# Patient Record
Sex: Female | Born: 1990
Health system: Southern US, Community
[De-identification: ages and names within clinical notes are randomized; demographics above are authoritative.]

## PROBLEM LIST (undated history)

## (undated) DIAGNOSIS — Z789 Other specified health status: Secondary | ICD-10-CM

## (undated) HISTORY — PX: NO PAST SURGERIES: SHX2092

## (undated) HISTORY — PX: WISDOM TOOTH EXTRACTION: SHX21

---

## 2015-10-10 DIAGNOSIS — Z01419 Encounter for gynecological examination (general) (routine) without abnormal findings: Secondary | ICD-10-CM | POA: Diagnosis not present

## 2015-10-10 DIAGNOSIS — R102 Pelvic and perineal pain: Secondary | ICD-10-CM | POA: Diagnosis not present

## 2015-10-10 DIAGNOSIS — E559 Vitamin D deficiency, unspecified: Secondary | ICD-10-CM | POA: Diagnosis not present

## 2015-10-10 DIAGNOSIS — Z6822 Body mass index (BMI) 22.0-22.9, adult: Secondary | ICD-10-CM | POA: Diagnosis not present

## 2015-10-10 DIAGNOSIS — Z113 Encounter for screening for infections with a predominantly sexual mode of transmission: Secondary | ICD-10-CM | POA: Diagnosis not present

## 2015-10-10 DIAGNOSIS — Z01411 Encounter for gynecological examination (general) (routine) with abnormal findings: Secondary | ICD-10-CM | POA: Diagnosis not present

## 2015-11-01 DIAGNOSIS — R1031 Right lower quadrant pain: Secondary | ICD-10-CM | POA: Diagnosis not present

## 2016-07-09 ENCOUNTER — Encounter: Payer: Self-pay | Admitting: Obstetrics and Gynecology

## 2016-07-09 ENCOUNTER — Ambulatory Visit: Payer: Self-pay | Admitting: Obstetrics and Gynecology

## 2016-07-09 ENCOUNTER — Ambulatory Visit (INDEPENDENT_AMBULATORY_CARE_PROVIDER_SITE_OTHER): Payer: 59 | Admitting: Obstetrics and Gynecology

## 2016-07-09 ENCOUNTER — Other Ambulatory Visit (INDEPENDENT_AMBULATORY_CARE_PROVIDER_SITE_OTHER): Payer: 59

## 2016-07-09 VITALS — BP 100/58 | HR 84 | Resp 14 | Ht 62.0 in | Wt 123.0 lb

## 2016-07-09 DIAGNOSIS — N83201 Unspecified ovarian cyst, right side: Secondary | ICD-10-CM | POA: Diagnosis not present

## 2016-07-09 DIAGNOSIS — Z3009 Encounter for other general counseling and advice on contraception: Secondary | ICD-10-CM

## 2016-07-09 DIAGNOSIS — R102 Pelvic and perineal pain: Secondary | ICD-10-CM | POA: Diagnosis not present

## 2016-07-09 MED ORDER — LEVONORGEST-ETH ESTRAD 91-DAY 0.1-0.02 & 0.01 MG PO TABS: 1.0000 | ORAL_TABLET | Freq: Every day | ORAL | 1 refills | Status: DC

## 2016-07-09 NOTE — Patient Instructions (Addendum)
Oral Contraception Information Oral contraceptive pills (OCPs) are medicines taken to prevent pregnancy. OCPs work by preventing the ovaries from releasing eggs. The hormones in OCPs also cause the cervical mucus to thicken, preventing the sperm from entering the uterus. The hormones also cause the uterine lining to become thin, not allowing a fertilized egg to attach to the inside of the uterus. OCPs are highly effective when taken exactly as prescribed. However, OCPs do not prevent sexually transmitted diseases (STDs). Safe sex practices, such as using condoms along with the pill, can help prevent STDs.  Before taking the pill, you may have a physical exam and Pap test. Your health care provider may order blood tests. The health care provider will make sure you are a good candidate for oral contraception. Discuss with your health care provider the possible side effects of the OCP you may be prescribed. When starting an OCP, it can take 2 to 3 months for the body to adjust to the changes in hormone levels in your body.  TYPES OF ORAL CONTRACEPTION  The combination pill--This pill contains estrogen and progestin (synthetic progesterone) hormones. The combination pill comes in 21-day, 28-day, or 91-day packs. Some types of combination pills are meant to be taken continuously (365-day pills). With 21-day packs, you do not take pills for 7 days after the last pill. With 28-day packs, the pill is taken every day. The last 7 pills are without hormones. Certain types of pills have more than 21 hormone-containing pills. With 91-day packs, the first 84 pills contain both hormones, and the last 7 pills contain no hormones or contain estrogen only.  The minipill--This pill contains the progesterone hormone only. The pill is taken every day continuously. It is very important to take the pill at the same time each day. The minipill comes in packs of 28 pills. All 28 pills contain the hormone.  ADVANTAGES OF ORAL  CONTRACEPTIVE PILLS  Decreases premenstrual symptoms.   Treats menstrual period cramps.   Regulates the menstrual cycle.   Decreases a heavy menstrual flow.   May treatacne, depending on the type of pill.   Treats abnormal uterine bleeding.   Treats polycystic ovarian syndrome.   Treats endometriosis.   Can be used as emergency contraception.  THINGS THAT CAN MAKE ORAL CONTRACEPTIVE PILLS LESS EFFECTIVE OCPs can be less effective if:   You forget to take the pill at the same time every day.   You have a stomach or intestinal disease that lessens the absorption of the pill.   You take OCPs with other medicines that make OCPs less effective, such as antibiotics, certain HIV medicines, and some seizure medicines.   You take expired OCPs.   You forget to restart the pill on day 7, when using the packs of 21 pills.  RISKS ASSOCIATED WITH ORAL CONTRACEPTIVE PILLS  Oral contraceptive pills can sometimes cause side effects, such as:  Headache.  Nausea.  Breast tenderness.  Irregular bleeding or spotting. Combination pills are also associated with a small increased risk of:  Blood clots.  Heart attack.  Stroke.   This information is not intended to replace advice given to you by your health care provider. Make sure you discuss any questions you have with your health care provider.   Document Released: 12/07/2002 Document Revised: 07/07/2013 Document Reviewed: 03/07/2013 Elsevier Interactive Patient Education 2016 Elsevier Inc. Ovarian Cyst An ovarian cyst is a fluid-filled sac that forms on an ovary. The ovaries are small organs that produce eggs in  women. Various types of cysts can form on the ovaries. Most are not cancerous. Many do not cause problems, and they often go away on their own. Some may cause symptoms and require treatment. Common types of ovarian cysts include:  Functional cysts--These cysts may occur every month during the menstrual cycle.  This is normal. The cysts usually go away with the next menstrual cycle if the woman does not get pregnant. Usually, there are no symptoms with a functional cyst.  Endometrioma cysts--These cysts form from the tissue that lines the uterus. They are also called "chocolate cysts" because they become filled with blood that turns brown. This type of cyst can cause pain in the lower abdomen during intercourse and with your menstrual period.  Cystadenoma cysts--This type develops from the cells on the outside of the ovary. These cysts can get very big and cause lower abdomen pain and pain with intercourse. This type of cyst can twist on itself, cut off its blood supply, and cause severe pain. It can also easily rupture and cause a lot of pain.  Dermoid cysts--This type of cyst is sometimes found in both ovaries. These cysts may contain different kinds of body tissue, such as skin, teeth, hair, or cartilage. They usually do not cause symptoms unless they get very big.  Theca lutein cysts--These cysts occur when too much of a certain hormone (human chorionic gonadotropin) is produced and overstimulates the ovaries to produce an egg. This is most common after procedures used to assist with the conception of a baby (in vitro fertilization). CAUSES   Fertility drugs can cause a condition in which multiple large cysts are formed on the ovaries. This is called ovarian hyperstimulation syndrome.  A condition called polycystic ovary syndrome can cause hormonal imbalances that can lead to nonfunctional ovarian cysts. SIGNS AND SYMPTOMS  Many ovarian cysts do not cause symptoms. If symptoms are present, they may include:  Pelvic pain or pressure.  Pain in the lower abdomen.  Pain during sexual intercourse.  Increasing girth (swelling) of the abdomen.  Abnormal menstrual periods.  Increasing pain with menstrual periods.  Stopping having menstrual periods without being pregnant. DIAGNOSIS  These cysts  are commonly found during a routine or annual pelvic exam. Tests may be ordered to find out more about the cyst. These tests may include:  Ultrasound.  X-ray of the pelvis.  CT scan.  MRI.  Blood tests. TREATMENT  Many ovarian cysts go away on their own without treatment. Your health care provider may want to check your cyst regularly for 2-3 months to see if it changes. For women in menopause, it is particularly important to monitor a cyst closely because of the higher rate of ovarian cancer in menopausal women. When treatment is needed, it may include any of the following:  A procedure to drain the cyst (aspiration). This may be done using a long needle and ultrasound. It can also be done through a laparoscopic procedure. This involves using a thin, lighted tube with a tiny camera on the end (laparoscope) inserted through a small incision.  Surgery to remove the whole cyst. This may be done using laparoscopic surgery or an open surgery involving a larger incision in the lower abdomen.  Hormone treatment or birth control pills. These methods are sometimes used to help dissolve a cyst. HOME CARE INSTRUCTIONS   Only take over-the-counter or prescription medicines as directed by your health care provider.  Follow up with your health care provider as directed.  Get  regular pelvic exams and Pap tests. SEEK MEDICAL CARE IF:   Your periods are late, irregular, or painful, or they stop.  Your pelvic pain or abdominal pain does not go away.  Your abdomen becomes larger or swollen.  You have pressure on your bladder or trouble emptying your bladder completely.  You have pain during sexual intercourse.  You have feelings of fullness, pressure, or discomfort in your stomach.  You lose weight for no apparent reason.  You feel generally ill.  You become constipated.  You lose your appetite.  You develop acne.  You have an increase in body and facial hair.  You are gaining  weight, without changing your exercise and eating habits.  You think you are pregnant. SEEK IMMEDIATE MEDICAL CARE IF:   You have increasing abdominal pain.  You feel sick to your stomach (nauseous), and you throw up (vomit).  You develop a fever that comes on suddenly.  You have abdominal pain during a bowel movement.  Your menstrual periods become heavier than usual. MAKE SURE YOU:  Understand these instructions.  Will watch your condition.  Will get help right away if you are not doing well or get worse.   This information is not intended to replace advice given to you by your health care provider. Make sure you discuss any questions you have with your health care provider.   Document Released: 09/16/2005 Document Revised: 09/21/2013 Document Reviewed: 05/24/2013 Elsevier Interactive Patient Education [employer]2016 Elsevier Inc.  No intercourse for at least 1 week. When sexually active, you should control rate and depth of penetration.

## 2016-07-09 NOTE — Progress Notes (Signed)
GYNECOLOGY  VISIT   HPI: 25 y.o.   Married  Hispanic  female   G0P0000 with Patient's last menstrual period was 06/10/2016.   here c/o RLQ pain. The patient reports a one week h/o RLQ abdominal/pelvic pain. The pain is up to an 8/10 with movement or intercourse. When she is still it is a 1/10. Sharp. She has had intermittent pain in this same area cyclically over the last year. She has pain most months, but not every month. Never on the left. She has baseline issues with constipation, typically has a BM every other day, can go a week at times without BM. No urinary c/o.  She did have an ultrasound last year for the same pain, had a 2 cm cyst.  She has a nexplanon for contraception.  She is worried about remembering the pill. She has had a cycle every month for the last 4 months, sometimes she skips a cycle (this nexplanon has been in for 2 years). She bleeds x 5-6 days. She can saturate a pad in 2 hours. Cramps are bad, controlled with ibuprofen. She c/o decreased libido. Able to enjoy intercourse and have orgasms. She is working full time and going to school full time.   GYNECOLOGIC HISTORY: Patient's last menstrual period was 06/10/2016. Contraception:Nexplanon  Menopausal hormone therapy: none         OB History    Gravida Para Term Preterm AB Living   0 0 0 0 0 0   SAB TAB Ectopic Multiple Live Births   0 0 0 0 0         There are no active problems to display for this patient.   No past medical history on file.  No past surgical history on file.  Current Outpatient Prescriptions  Medication Sig Dispense Refill  . etonogestrel (NEXPLANON) 68 MG IMPL implant Inject into the skin.     No current facility-administered medications for this visit.      ALLERGIES: Review of patient's allergies indicates no known allergies.  Family History  Problem Relation Age of Onset  . Cirrhosis Mother     Social History   Social History  . Marital status: Married    Spouse  name: N/A  . Number of children: N/A  . Years of education: N/A   Occupational History  . Not on file.   Social History Main Topics  . Smoking status: Never Smoker  . Smokeless tobacco: Never Used  . Alcohol use No  . Drug use: No  . Sexual activity: Yes    Partners: Male    Birth control/ protection: Implant   Other Topics Concern  . Not on file   Social History Narrative  . No narrative on file    Review of Systems  Constitutional: Negative.   HENT: Negative.   Eyes: Negative.   Respiratory: Negative.   Cardiovascular: Negative.   Gastrointestinal: Negative.   Genitourinary:       RLQ pain   Musculoskeletal: Negative.   Skin: Negative.   Neurological: Negative.   Endo/Heme/Allergies: Negative.   Psychiatric/Behavioral: Negative.     PHYSICAL EXAMINATION:    BP (!) 100/58 (BP Location: Right Arm, Patient Position: Sitting, Cuff Size: Normal)   Pulse 84   Resp 14   Ht 5\' 2"  (1.575 m)   Wt 123 lb (55.8 kg)   LMP 06/10/2016   BMI 22.50 kg/m     General appearance: alert, cooperative and appears stated age Abdomen: soft, non-tender; non-distended,  no masses,  no organomegaly  Pelvic: External genitalia:  no lesions              Urethra:  normal appearing urethra with no masses, tenderness or lesions              Cervix: no cervical motion tenderness              Bimanual Exam:  Uterus:  normal size, contour, position, consistency, mobility, non-tender and anteverted              Adnexa: right adnexal mass, tender, approximately 4-5 cm. No masses or tenderness on the left                Ultrasound images reviewed with the patient.   ASSESSMENT RLQ abdominal/pelvic pain Right ovarian cyst, 4.2 cm, simple, +blood flow. Likely follicular cyst Contraception, currently has a nexplanon Low libido, likely from stress (work and school full time) Constipation    PLAN Ibuprofen for pain (over the counter) Avoid intercourse for at least the next week When  sexually active, she needs control rate and depth of penetration F/U exam in 3 months. The cyst is easily palpated on exam, if still present at her f/u visit will schedule another ultrasound Discussed that the nexplanon could be causing the ovarian cysts Discussed the use of OCP's to suppress ovarian cysts.   No contraindications to OCP's, risks reviewed Will start loseasonique, she will alarm her phone to remember to take it If she does well with OCP's in the next few months, will pull the nexplanon Due for an annual exam in 1/18 Discussed libido, information given Discussed fruit, fiber, fluids, metamucil and miralax. Discussed which fruits to avoid.    An After Visit Summary was printed and given to the patient.

## 2016-07-12 MED FILL — CAMRESE LO TABLET: 0.1-0.02 & | 90 days supply | Qty: 91 | Fill #0

## 2016-08-26 ENCOUNTER — Ambulatory Visit: Payer: Self-pay | Admitting: Obstetrics and Gynecology

## 2016-10-23 DIAGNOSIS — Z01419 Encounter for gynecological examination (general) (routine) without abnormal findings: Secondary | ICD-10-CM | POA: Diagnosis not present

## 2016-10-23 DIAGNOSIS — Z6822 Body mass index (BMI) 22.0-22.9, adult: Secondary | ICD-10-CM | POA: Diagnosis not present

## 2016-10-23 DIAGNOSIS — E559 Vitamin D deficiency, unspecified: Secondary | ICD-10-CM | POA: Diagnosis not present

## 2016-10-23 DIAGNOSIS — Z3149 Encounter for other procreative investigation and testing: Secondary | ICD-10-CM | POA: Diagnosis not present

## 2016-11-29 ENCOUNTER — Ambulatory Visit (INDEPENDENT_AMBULATORY_CARE_PROVIDER_SITE_OTHER): Payer: 59 | Admitting: Nurse Practitioner

## 2016-11-29 ENCOUNTER — Encounter: Payer: Self-pay | Admitting: Nurse Practitioner

## 2016-11-29 VITALS — BP 118/64 | HR 64 | Ht 62.0 in | Wt 121.0 lb

## 2016-11-29 DIAGNOSIS — S6992XA Unspecified injury of left wrist, hand and finger(s), initial encounter: Secondary | ICD-10-CM

## 2016-11-29 NOTE — Progress Notes (Signed)
26 y.o. Married Hispanic female G0P0000 here for left thumb injury that occurred before Christmas when she slammed into a car door.  It has been growing out but now at 2 levels midway and is loose.  She has been trimming the nail but is getting caught on clothing which causes more discomfort.  She now desires the end of nail to come off.   O: Healthy WD,WN female Affect: normal Skin & Nails:  Left thumb has partially grown up to the middle.  The top half is discolored at the middle of original trauma.  The very tip of nail is adhered to the skin.  Area is cleansed with Betadine and TB syringe with Lidocaine is injected into both side at 0.1 amounts.  The lateral side then is lifted up and trimmed going to the top and medial side.  The most medial side is then lifted and cut with minimal amount of bleeding.  Rest of nail was removed without a problem.  The remaining triangle piece was then removed with hemostat.  Area is then cleaned and antibacterial ointment is placed with dressing.      A: Trauma to left thumbnail  Removal of remainder of thumbnail     P:  Discussed and instructions for wound care with removal of bandage in a few hours.  Keep area clean and dry over the weekend and may use a band aid prn.  She did take 2 Advil today.    RV

## 2016-11-29 NOTE — Patient Instructions (Signed)
Keep area clean and dry. °

## 2016-12-08 NOTE — Progress Notes (Signed)
Encounter reviewed by Dr. Ezella Kell Amundson C. Silva.  

## 2016-12-23 MED FILL — LEVONO-E ESTRAD 0.10-0.02-0: 0.1-0.02 & | 90 days supply | Qty: 91 | Fill #1

## 2016-12-25 ENCOUNTER — Ambulatory Visit (INDEPENDENT_AMBULATORY_CARE_PROVIDER_SITE_OTHER): Payer: 59 | Admitting: Obstetrics and Gynecology

## 2016-12-25 ENCOUNTER — Encounter: Payer: Self-pay | Admitting: Obstetrics and Gynecology

## 2016-12-25 VITALS — BP 102/68 | HR 72 | Resp 16 | Ht 62.0 in | Wt 123.0 lb

## 2016-12-25 DIAGNOSIS — Z3046 Encounter for surveillance of implantable subdermal contraceptive: Secondary | ICD-10-CM

## 2016-12-25 NOTE — Progress Notes (Signed)
GYNECOLOGY  VISIT   HPI: 26 y.o.   Married  Hispanic  female   G0P0000 with No LMP recorded. Patient has had an implant.   here for  Nexplanon removal. She has started on OCP's and is doing well on them.   GYNECOLOGIC HISTORY: No LMP recorded. Patient has had an implant. Contraception:Nexplanon  Menopausal hormone therapy: none         OB History    Gravida Para Term Preterm AB Living   0 0 0 0 0 0   SAB TAB Ectopic Multiple Live Births   0 0 0 0 0         There are no active problems to display for this patient.   No past medical history on file.  No past surgical history on file.  Current Outpatient Prescriptions  Medication Sig Dispense Refill  . etonogestrel (NEXPLANON) 68 MG IMPL implant Inject into the skin.    . Levonorgestrel-Ethinyl Estradiol (LOSEASONIQUE) 0.1-0.02 & 0.01 MG tablet Take 1 tablet by mouth daily. 1 Package 1   No current facility-administered medications for this visit.      ALLERGIES: Patient has no known allergies.  Family History  Problem Relation Age of Onset  . Cirrhosis Mother     Social History   Social History  . Marital status: Married    Spouse name: N/A  . Number of children: N/A  . Years of education: N/A   Occupational History  . Not on file.   Social History Main Topics  . Smoking status: Never Smoker  . Smokeless tobacco: Never Used  . Alcohol use No  . Drug use: No  . Sexual activity: Yes    Partners: Male    Birth control/ protection: Implant   Other Topics Concern  . Not on file   Social History Narrative  . No narrative on file    Review of Systems  Constitutional: Negative.   HENT: Negative.   Eyes: Negative.   Respiratory: Negative.   Cardiovascular: Negative.   Gastrointestinal: Negative.   Genitourinary: Negative.   Musculoskeletal: Negative.   Skin: Negative.   Neurological: Negative.   Endo/Heme/Allergies: Negative.   Psychiatric/Behavioral: Negative.     PHYSICAL EXAMINATION:    BP  102/68 (BP Location: Right Arm, Patient Position: Sitting, Cuff Size: Normal)   Pulse 72   Resp 16   Wt 123 lb (55.8 kg)   BMI 22.50 kg/m     General appearance: alert, cooperative and appears stated age   Risks of nexplanon removal was reviewed with the patient and a consent was signed.  The patient was placed in the supine position with her left arm bent at the elbow. The area was cleansed with betadine and injected with 1% lidocaine. A #11 blade was used to incise over the distal end of the nexplanon implant. The implant was grasped with a clamp and removed.   The patients arm was cleansed of betadine and a steri strip was placed over the incision. A gauze was wrapped around her arm.  She tolerated the procedure well  Instructions for care were discussed.     ASSESSMENT Nexplanon removal done    PLAN She is on OCP's Routine f/u   An After Visit Summary was printed and given to the patient.

## 2017-04-08 DIAGNOSIS — M25511 Pain in right shoulder: Secondary | ICD-10-CM | POA: Diagnosis not present

## 2017-05-24 ENCOUNTER — Emergency Department (INDEPENDENT_AMBULATORY_CARE_PROVIDER_SITE_OTHER)
Admission: EM | Admit: 2017-05-24 | Discharge: 2017-05-24 | Disposition: A | Discharge status: Home / Self Care | Payer: 59 | Source: Home / Self Care | Attending: Family Medicine | Admitting: Family Medicine

## 2017-05-24 ENCOUNTER — Encounter: Payer: Self-pay | Admitting: Emergency Medicine

## 2017-05-24 ENCOUNTER — Other Ambulatory Visit: Payer: Self-pay | Admitting: Family Medicine

## 2017-05-24 DIAGNOSIS — J029 Acute pharyngitis, unspecified: Secondary | ICD-10-CM

## 2017-05-24 LAB — POCT RAPID STREP A (OFFICE): RAPID STREP A SCREEN: NEGATIVE

## 2017-05-24 MED ORDER — PENICILLIN V POTASSIUM 500 MG PO TABS: ORAL_TABLET | ORAL | 0 refills | Status: DC

## 2017-05-24 NOTE — ED Provider Notes (Signed)
[facility]    CSN: [id number] Arrival date & time: 05/24/17  1652     History   Chief Complaint Chief Complaint  Patient presents with  . Sore Throat    HPI Renee Lucas is a 26 y.o. female.   Patient developed sore throat three days ago.  She has been fatigued with myalgias, headache, and increased sinus congestion. She recently returned from [place].  She reports that she has had a negative rapid strep test in the past with subsequent positive throat culture.   The history is provided by the patient.    History reviewed. No pertinent past medical history.  There are no active problems to display for this patient.   History reviewed. No pertinent surgical history.  OB History    Gravida Para Term Preterm AB Living   0 0 0 0 0 0   SAB TAB Ectopic Multiple Live Births   0 0 0 0 0       Home Medications    Prior to Admission medications   Medication Sig Start Date End Date Taking? Authorizing Provider  etonogestrel (NEXPLANON) 68 MG IMPL implant Inject into the skin.    [provider]  Levonorgestrel-Ethinyl Estradiol (LOSEASONIQUE) 0.1-0.02 & 0.01 MG tablet Take 1 tablet by mouth daily. 07/09/16   Romualdo Bolk, MD  penicillin v potassium (VEETID) 500 MG tablet Take one tab by mouth twice daily for 10 days (Rx void after 05/31/17) 05/24/17   Lattie Haw, MD    Family History Family History  Problem Relation Age of Onset  . Cirrhosis Mother     Social History Social History  Substance Use Topics  . Smoking status: Never Smoker  . Smokeless tobacco: Never Used  . Alcohol use No     Allergies   Patient has no known allergies.   Review of Systems Review of Systems + sore throat No cough No pleuritic pain No wheezing + nasal congestion + post-nasal drainage No sinus pain/pressure No itchy/red eyes No earache No hemoptysis No SOB No fever/chills No nausea No vomiting No abdominal pain No diarrhea No  urinary symptoms No skin rash + fatigue + myalgias + headache Used OTC meds without relief   Physical Exam Triage Vital Signs ED Triage Vitals  Enc Vitals Group     BP 05/24/17 1734 114/75     Pulse Rate 05/24/17 1734 87     Resp 05/24/17 1734 16     Temp 05/24/17 1734 98.2 F (36.8 C)     Temp Source 05/24/17 1734 Oral     SpO2 05/24/17 1734 99 %     Weight 05/24/17 1734 117 lb 8 oz (53.3 kg)     Height 05/24/17 1734 5\' 2"  (1.575 m)     Head Circumference --      Peak Flow --      Pain Score 05/24/17 1735 4     Pain Loc --      Pain Edu? --      Excl. in GC? --    No data found.   Updated Vital Signs BP 114/75 (BP Location: Left Arm)   Pulse 87   Temp 98.2 F (36.8 C) (Oral)   Resp 16   Ht 5\' 2"  (1.575 m)   Wt 117 lb 8 oz (53.3 kg)   SpO2 99%   BMI 21.49 kg/m   Visual Acuity Right Eye Distance:   Left Eye Distance:   Bilateral Distance:  Right Eye Near:   Left Eye Near:    Bilateral Near:     Physical Exam Nursing notes and Vital Signs reviewed. Appearance:  Patient appears stated age, and in no acute distress Eyes:  Pupils are equal, round, and reactive to light and accomodation.  Extraocular movement is intact.  Conjunctivae are not inflamed  Ears:  Canals normal.  Tympanic membranes normal.  Nose:  Mildly congested turbinates.  No sinus tenderness.   Pharynx:  Mildly erythematous Neck:  Supple.  Enlarged posterior/lateral nodes are palpated bilaterally, tender to palpation on the left.   Lungs:  Clear to auscultation.  Breath sounds are equal.  Moving air well. Heart:  Regular rate and rhythm without murmurs, rubs, or gallops.  Abdomen:  Nontender without masses or hepatosplenomegaly.  Bowel sounds are present.  No CVA or flank tenderness.  Extremities:  No edema.  Skin:  No rash present.    UC Treatments / Results  Labs (all labs ordered are listed, but only abnormal results are displayed) Labs Reviewed  CULTURE, GROUP A STREP [facility])  POCT  RAPID STREP A (OFFICE) negative    EKG  EKG Interpretation None       Radiology No results found.  Procedures Procedures (including critical care time)  Medications Ordered in UC Medications - No data to display   Initial Impression / Assessment and Plan / UC Course  I have reviewed the triage vital signs and the nursing notes.  Pertinent labs & imaging results that were available during my care of the patient were reviewed by me and considered in my medical decision making (see chart for details).    Suspect early viral URI.  Throat culture pending. Try warm salt water gargles for sore throat.  May continue Ibuprofen 200mg , 3 or 4 tabs every 8 hours with food for sore throat, body aches, etc. Begin penicillin if throat culture is positive (given Rx to hold).  If cold-like symptoms develop, try the following: Take plain guaifenesin (1200mg  extended release tabs such as Mucinex) twice daily, with plenty of water, for cough and congestion.  May add Pseudoephedrine (30mg , one or two every 4 to 6 hours) for sinus congestion.  Get adequate rest.   May use Afrin nasal spray (or generic oxymetazoline) each morning for about 5 days and then discontinue.  Also recommend using saline nasal spray several times daily and saline nasal irrigation (AYR is a common brand).  Use Flonase nasal spray each morning after using Afrin nasal spray and saline nasal irrigation. May take Delsym Cough Suppressant at bedtime for nighttime cough.  Stop all antihistamines for now, and other non-prescription cough/cold preparations.    Final Clinical Impressions(s) / UC Diagnoses   Final diagnoses:  Pharyngitis, unspecified etiology    New Prescriptions New Prescriptions   PENICILLIN V POTASSIUM (VEETID) 500 MG TABLET    Take one tab by mouth twice daily for 10 days (Rx void after 05/31/17)        Lattie Haw, MD 06/01/17 [phone]

## 2017-05-24 NOTE — ED Triage Notes (Signed)
Patient presents to [facility] with C/O sore throat times 3 days. Hurts to swallow 4/10. Patrient has been to [place] in the las two weeks.

## 2017-05-24 NOTE — Discharge Instructions (Signed)
Try warm salt water gargles for sore throat.  May continue Ibuprofen 200mg , 3 or 4 tabs every 8 hours with food for sore throat, body aches, etc. Begin antibiotic if throat culture is positive.  If cold-like symptoms develop, try the following: Take plain guaifenesin (1200mg  extended release tabs such as Mucinex) twice daily, with plenty of water, for cough and congestion.  May add Pseudoephedrine (30mg , one or two every 4 to 6 hours) for sinus congestion.  Get adequate rest.   May use Afrin nasal spray (or generic oxymetazoline) each morning for about 5 days and then discontinue.  Also recommend using saline nasal spray several times daily and saline nasal irrigation (AYR is a common brand).  Use Flonase nasal spray each morning after using Afrin nasal spray and saline nasal irrigation. May take Delsym Cough Suppressant at bedtime for nighttime cough.  Stop all antihistamines for now, and other non-prescription cough/cold preparations.

## 2017-05-26 ENCOUNTER — Telehealth: Payer: Self-pay | Admitting: *Deleted

## 2017-05-26 LAB — CULTURE, GROUP A STREP

## 2017-05-26 NOTE — Telephone Encounter (Signed)
Patient advised of negative TCX.

## 2017-09-17 DIAGNOSIS — M25511 Pain in right shoulder: Secondary | ICD-10-CM | POA: Diagnosis not present

## 2017-09-22 ENCOUNTER — Other Ambulatory Visit: Payer: Self-pay | Admitting: Physician Assistant

## 2017-09-22 DIAGNOSIS — M25511 Pain in right shoulder: Secondary | ICD-10-CM

## 2017-09-29 ENCOUNTER — Ambulatory Visit (INDEPENDENT_AMBULATORY_CARE_PROVIDER_SITE_OTHER): Payer: 59

## 2017-09-29 DIAGNOSIS — M25511 Pain in right shoulder: Secondary | ICD-10-CM | POA: Diagnosis not present

## 2017-09-29 DIAGNOSIS — M7501 Adhesive capsulitis of right shoulder: Secondary | ICD-10-CM | POA: Diagnosis not present

## 2017-10-22 ENCOUNTER — Encounter: Payer: Self-pay | Admitting: Rehabilitative and Restorative Service Providers"

## 2017-10-22 ENCOUNTER — Ambulatory Visit (INDEPENDENT_AMBULATORY_CARE_PROVIDER_SITE_OTHER): Payer: 59 | Admitting: Rehabilitative and Restorative Service Providers"

## 2017-10-22 DIAGNOSIS — R293 Abnormal posture: Secondary | ICD-10-CM | POA: Diagnosis not present

## 2017-10-22 DIAGNOSIS — M25511 Pain in right shoulder: Secondary | ICD-10-CM | POA: Diagnosis not present

## 2017-10-22 DIAGNOSIS — G8929 Other chronic pain: Secondary | ICD-10-CM

## 2017-10-22 DIAGNOSIS — R29898 Other symptoms and signs involving the musculoskeletal system: Secondary | ICD-10-CM

## 2017-10-22 NOTE — Therapy (Signed)
[facility] [address] [place], [place], [address] Phone: [phone]   Fax:  [phone]  Physical Therapy Evaluation  Patient Details  Name: Renee Lucas MRN: [id number] Date of Birth: 02/15/1991 Referring Provider: Marvene StaffSazonne Stratton PA-C   Encounter Date: 10/22/2017  PT End of Session - 10/22/17 0940    Visit Number  1    Number of Visits  12    Date for PT Re-Evaluation  12/03/17    PT Start Time  0933    PT Stop Time  1030    PT Time Calculation (min)  57 min    Activity Tolerance  Patient tolerated treatment well       History reviewed. No pertinent past medical history.  History reviewed. No pertinent surgical history.  There were no vitals filed for this visit.   Subjective Assessment - 10/22/17 0942    Subjective  Renee Lucas reports that she has been having Rt shoulder pain for a year. She remembers pulling on a rope while tubing in the summer. She started having Rt shoulder pain a couple of weeks later. She continued to have symtpoms with intensity gradually increasing. Patient has pain at night and is now unable to sleep on Rt shoulder. Pain is primarily at night.     Diagnostic tests  xray MRI- frozen shoulder tendonitis    Patient Stated Goals  get rid of the Rt shoulder pain - sleep at night     Currently in Pain?  Yes    Pain Score  1     Pain Location  Shoulder    Pain Orientation  Right    Pain Descriptors / Indicators  Dull    Pain Type  Chronic pain    Pain Radiating Towards  shoulder blade area     Pain Onset  More than a month ago    Pain Frequency  Constant    Aggravating Factors   lying on Rt side; trying to sleep; household chores will irritate symtpoms; busy day at work increases pain at night     Pain Relieving Factors  meds         [facility] - 10/22/17 0001      Assessment   Medical Diagnosis  Rt shoulder pain     Referring Provider  Marvene StaffSazonne Stratton PA-C    Onset  Date/Surgical Date  09/30/16    Hand Dominance  Right    Next MD Visit  PRN    Prior Therapy  none       Precautions   Precautions  None      Balance Screen   Has the patient fallen in the past 6 months  No    Has the patient had a decrease in activity level because of a fear of falling?   No    Is the patient reluctant to leave their home because of a fear of falling?   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     Leisure  household chores       Observation/Other Assessments   Focus on Therapeutic Outcomes (FOTO)   33% limitation       Sensation   Additional Comments  some numbness in Rt hand if she is lying on the Rt side  Posture/Postural Control   Posture Comments  head forward; shoulders rounded and elevated, Head of humerus anterior in orientation; scapulae abducted and rotated along the thoracic wall       AROM   Right Shoulder Extension  60 Degrees    Right Shoulder Flexion  151 Degrees    Right Shoulder ABduction  166 Degrees    Right Shoulder Internal Rotation  33 Degrees    Right Shoulder External Rotation  79 Degrees    Left Shoulder Extension  55 Degrees    Left Shoulder Flexion  167 Degrees    Left Shoulder ABduction  172 Degrees    Left Shoulder Internal Rotation  51 Degrees    Left Shoulder External Rotation  99 Degrees      Strength   Overall Strength Comments  5/5 bilat except Rt middle trap 5-/5 and lower trap 4+/5 and painful       Palpation   Spinal mobility  decreased mobilty thoracic spine     Palpation comment  significant tightness Rt > Lt upper trap. leveator, pecs, teres, biceps              Objective measurements completed on examination: See above findings.      [facility] Adult PT Treatment/Exercise - 10/22/17 0001      Neuro Re-ed    Neuro Re-ed Details   working on posture and  alignment       Shoulder Exercises: Standing   Other Standing Exercises  axial extension 10 sec x 5; scap squeeze 10 sec x 10; L's x 10; W's x 10 all with swim noodle       Shoulder Exercises: Stretch   Other Shoulder Stretches  3 way doorway 30 sec x 3 reps each position     Other Shoulder Stretches  shoulder flexion sliding hand up door facing 30 sec hold x 2       Moist Heat Therapy   Number Minutes Moist Heat  15 Minutes    Moist Heat Location  Shoulder Rt      Electrical Stimulation   Electrical Stimulation Location  Rt shoulder girdle     Electrical Stimulation Action  IFC    Electrical Stimulation Parameters  to tolerance    Electrical Stimulation Goals  Pain;Tone             PT Education - 10/22/17 1017    Education provided  Yes    Education Details  posture; HEP; TENS     Person(s) Educated  Patient    Methods  Explanation;Demonstration;Tactile cues;Verbal cues;Handout    Comprehension  Verbalized understanding;Returned demonstration;Verbal cues required          PT Long Term Goals - 10/22/17 1123      PT LONG TERM GOAL #1   Title  Improve posture and alignment with patient to demonstrated good upright posture with posterior shoulder girdle musculature engaged 12/03/17    Time  6    Period  Weeks    Status  New      PT LONG TERM GOAL #2   Title  Increased pain free ROM Rt shoulder to equal Lt shoulder 12/03/17    Time  6    Period  Weeks    Status  New      PT LONG TERM GOAL #3   Title  Patient reports sleeping through the night without awakening due to pain 12/03/17    Time  6    Period  Weeks  Status  New      PT LONG TERM GOAL #4   Title  Independent in HEP 12/03/17    Time  6    Period  Weeks    Status  New      PT LONG TERM GOAL #5   Title  Improve FOTO to 23% limitation 12/03/17    Time  6    Period  Weeks    Status  New             Plan - 10/22/17 1117    Clinical Impression Statement  Patient presents with ~ 1 year history of  Rt shoulder pain gradually progressing with pain primarily at night and with functional activities such as lying on Rt side and reaching behind her back. She has signs and symptoms consistent with adhesive capsulitis Rt shoulder. Patient has poor posture and alignment; limited shoulder Rt ROM; weakness and pain with resistive muscle testing in posterior shoudler girdle musculature; pain and tightness with palpation Rt shoulder pecs; upper trap; leveator; biceps; anterior deltoid; teres; decreased ability to sleep and decresaed functional activity tolerance. Patient will benefit from PT to address problems identified.     Clinical Presentation  Stable    Clinical Decision Making  Low    Rehab Potential  Good    PT Frequency  2x / week    PT Duration  6 weeks    PT Treatment/Interventions  Patient/family education;ADLs/Self Care Home Management;Cryotherapy;Electrical Stimulation;Iontophoresis 4mg /ml Dexamethasone;Moist Heat;Ultrasound;Dry needling;Manual techniques;Neuromuscular re-education;Therapeutic activities;Therapeutic exercise    PT Next Visit Plan  review HEP; progress with pulleys for ROM; stretch IR; manual work through the [employer] girdle with PROM/stretching Rt shoulder; modalities as indicated     Consulted and Agree with Plan of Care  Patient       Patient will benefit from skilled therapeutic intervention in order to improve the following deficits and impairments:  Postural dysfunction, Improper body mechanics, Pain, Impaired UE functional use, Increased muscle spasms, Increased fascial restricitons, Decreased mobility, Decreased range of motion, Decreased strength, Decreased activity tolerance  Visit Diagnosis: Chronic right shoulder pain - Plan: PT plan of care cert/re-cert  Other symptoms and signs involving the musculoskeletal system - Plan: PT plan of care cert/re-cert  Abnormal posture - Plan: PT plan of care cert/re-cert     Problem List There are no active problems  to display for this patient.   Tavita Eastham Rober Minion PT, MPH  10/22/2017, 11:28 AM  [facility] [address] [place], [place], [address] Phone: [phone]   Fax:  [phone]  Name: Renee Lucas MRN: [id number] Date of Birth: 06/01/1991

## 2017-10-22 NOTE — Patient Instructions (Signed)
Axial Extension (Chin Tuck)    Pull chin in and lengthen back of neck. Hold __5__ seconds while counting out loud. Repeat __10__ times. Do __several__ sessions per day.  Shoulder Blade Squeeze    Rotate shoulders back, then squeeze shoulder blades down and back. Hold 10 sec Repeat __10__ times. Do __several__ sessions per day. Can use swim noodle for tactile cues but can also do this without the noodle  Lift chest and bring shoulder blades down and back   Upper Back Strength: Lower Trapezius / Rotator Cuff " L's "     Arms in waitress pose, palms up. Press hands back and slide shoulder blades down. Hold for __5__ seconds. Repeat _10___ times. 1-2 times per day.    Scapular Retraction: Elbow Flexion (Standing)  "W's"     With elbows bent to 90, pinch shoulder blades together and rotate arms out, keeping elbows bent. Repeat __10__ times per set. Do __1-2__ sets per session. Do _several ___ sessions per day.   Scapula Adduction With Pectoralis Stretch: Low - Standing   Shoulders at 45 hands even with shoulders, keeping weight through legs, shift weight forward until you feel pull or stretch through the front of your chest. Hold _30__ seconds. Do _3__ times, _2-4__ times per day.   Scapula Adduction With Pectoralis Stretch: Mid-Range - Standing   Shoulders at 90 elbows even with shoulders, keeping weight through legs, shift weight forward until you feel pull or strength through the front of your chest. Hold __30_ seconds. Do _3__ times, __2-4_ times per day.   Scapula Adduction With Pectoralis Stretch: High - Standing   Shoulders at 120 hands up high on the doorway, keeping weight on feet, shift weight forward until you feel pull or stretch through the front of your chest. Hold _30__ seconds. Do _3__ times, _2-3__ times per day.  Slide right arm straight up the doorway until you feel a stretch. Hold 15-20 sec slide back down. Repeat 3-5 reps 2-3 times/day.   TENS  UNIT: This is helpful for muscle pain and spasm.   Search and Purchase a TENS 7000 2nd edition at www.tenspros.com. It should be less than $30.     TENS unit instructions: Do not shower or bathe with the unit on Turn the unit off before removing electrodes or batteries If the electrodes lose stickiness add a drop of water to the electrodes after they are disconnected from the unit and place on plastic sheet. If you continued to have difficulty, call the TENS unit company to purchase more electrodes. Do not apply lotion on the skin area prior to use. Make sure the skin is clean and dry as this will help prolong the life of the electrodes. After use, always check skin for unusual red areas, rash or other skin difficulties. If there are any skin problems, does not apply electrodes to the same area. Never remove the electrodes from the unit by pulling the wires. Do not use the TENS unit or electrodes other than as directed. Do not change electrode placement without consultating your therapist or physician. Keep 2 fingers with between each electrode.     [facility] [address] [place], [place] [address]  [phone] (office) [phone] (fax)

## 2017-10-29 ENCOUNTER — Encounter: Payer: Self-pay | Admitting: Rehabilitative and Restorative Service Providers"

## 2017-10-29 ENCOUNTER — Ambulatory Visit (INDEPENDENT_AMBULATORY_CARE_PROVIDER_SITE_OTHER): Payer: 59 | Admitting: Rehabilitative and Restorative Service Providers"

## 2017-10-29 DIAGNOSIS — R29898 Other symptoms and signs involving the musculoskeletal system: Secondary | ICD-10-CM

## 2017-10-29 DIAGNOSIS — M25511 Pain in right shoulder: Secondary | ICD-10-CM | POA: Diagnosis not present

## 2017-10-29 DIAGNOSIS — R293 Abnormal posture: Secondary | ICD-10-CM

## 2017-10-29 DIAGNOSIS — G8929 Other chronic pain: Secondary | ICD-10-CM | POA: Diagnosis not present

## 2017-10-29 NOTE — Patient Instructions (Signed)
ELBOW: Biceps - Standing    Standing in doorway, place one hand on wall, elbow straight. Step forward with weight staying through the legs. Hold _20-30__ seconds. _3__ reps per set, _1-2__ sets per day   Modify pec stretch weight through legs not shoulders

## 2017-10-29 NOTE — Therapy (Signed)
[facility] [place] [address] [place], [place], [address] Phone: [phone]   Fax:  [phone]  Physical Therapy Treatment  Patient Details  Name: Renee Lucas MRN: [id number] Date of Birth: 1991/01/30 Referring Provider: Marvene Staff PA-C   Encounter Date: 10/29/2017  PT End of Session - 10/29/17 1525    Visit Number  2    Number of Visits  12    Date for PT Re-Evaluation  12/03/17    PT Start Time  1518    PT Stop Time  1615    PT Time Calculation (min)  57 min    Activity Tolerance  Patient tolerated treatment well       History reviewed. No pertinent past medical history.  History reviewed. No pertinent surgical history.  There were no vitals filed for this visit.  Subjective Assessment - 10/29/17 1525    Subjective  Renee Lucas reports that she has been consistent with her HEP. The shoudler feels tight and she notices more pain with taking her shirt on or off than before she started therapy. Does not think she is overdoing the exercises. She still has difficulty sleeping at night     Currently in Pain?  No/denies                      [facility] Adult PT Treatment/Exercise - 10/29/17 0001      Shoulder Exercises: Standing   Other Standing Exercises  axial extension 10 sec x 5; scap squeeze 10 sec x 10; L's x 10; W's x 10 all with swim noodle       Shoulder Exercises: Pulleys   Flexion  -- 10 sec x 8    ABduction  -- 10 sec hold x 6      Shoulder Exercises: Stretch   Other Shoulder Stretches  3 way doorway 30 sec x 3 reps each position     Other Shoulder Stretches  biceps stretch 30 sec x 3 standing       Moist Heat Therapy   Number Minutes Moist Heat  20 Minutes    Moist Heat Location  Shoulder Rt      Electrical Stimulation   Electrical Stimulation Location  Rt shoulder girdle = biceps tendon anteriorly/pecs/upper trap/posterior shoulder     Electrical Stimulation Action  IFC    Electrical  Stimulation Parameters  to tolerance    Electrical Stimulation Goals  Pain;Tone      Iontophoresis   Type of Iontophoresis  Dexamethasone    Location  anterior Rt shoulder - biceps tendon area     Dose  120 mAmp    Time  10-12 hours       Manual Therapy   Manual therapy comments  pt supine     Joint Mobilization  GH jont circumduction     Soft tissue mobilization  deep tissue work through the pecs; anterior shoulder; biceps; anterior deltoid on Rt     Myofascial Release  Rt pecs     Scapular Mobilization  Rt              PT Education - 10/29/17 1656    Education provided  Yes    Education Details  HEP     Person(s) Educated  Patient    Methods  Explanation;Demonstration;Tactile cues;Verbal cues    Comprehension  Verbalized understanding;Returned demonstration;Verbal cues required;Tactile cues required          PT Long Term Goals - 10/29/17 1529  PT LONG TERM GOAL #1   Title  Improve posture and alignment with patient to demonstrated good upright posture with posterior shoulder girdle musculature engaged 12/03/17    Time  6    Period  Weeks    Status  On-going      PT LONG TERM GOAL #2   Title  Increased pain free ROM Rt shoulder to equal Lt shoulder 12/03/17    Time  6    Period  Weeks    Status  On-going      PT LONG TERM GOAL #3   Title  Patient reports sleeping through the night without awakening due to pain 12/03/17    Time  6    Period  Weeks    Status  On-going      PT LONG TERM GOAL #4   Title  Independent in HEP 12/03/17    Time  6    Period  Weeks    Status  On-going      PT LONG TERM GOAL #5   Title  Improve FOTO to 23% limitation 12/03/17    Time  6    Period  Weeks    Status  On-going            Plan - 10/29/17 1658    Clinical Impression Statement  Patient reports compliance with HEP She has continued anterior Rt shoulder pain which sometimes feels worse than before beginning therapy. Can feel tightness in the pecs and anterior  shoulder with manual work. Specific area of tightness and pain is the biceps tendon area. Noted improved tissue extensibility with manual work and stretching. Pec stretching corrected with patient to wt bear through LE's not UE's. She will add biceps stretch. Added trial of ionto patch as well. Will continue assessment and modification of treatment program as indicated.     Rehab Potential  Good    PT Frequency  2x / week    PT Duration  6 weeks    PT Treatment/Interventions  Patient/family education;ADLs/Self Care Home Management;Cryotherapy;Electrical Stimulation;Iontophoresis 4mg /ml Dexamethasone;Moist Heat;Ultrasound;Dry needling;Manual techniques;Neuromuscular re-education;Therapeutic activities;Therapeutic exercise    PT Next Visit Plan  assess response to manual work through the [employer] [place] shoud[place] pecs and biceps/anterior deltoid; check pec stretch; add prolonged pec stretch with trunk rotatioin; assess response to ionto; modalities as indicated     Consulted and Agree with Plan of Care  Patient       Patient will benefit from skilled therapeutic intervention in order to improve the following deficits and impairments:  Postural dysfunction, Improper body mechanics, Pain, Impaired UE functional use, Increased muscle spasms, Increased fascial restricitons, Decreased mobility, Decreased range of motion, Decreased strength, Decreased activity tolerance  Visit Diagnosis: Chronic right shoulder pain  Other symptoms and signs involving the musculoskeletal system  Abnormal posture     Problem List There are no active problems to display for this patient.   Renee Lucas PT, MPH  10/29/2017, 5:09 PM  [facility] [address] [place], [place], [address] Phone: [phone]   Fax:  [phone]  Name: Renee Lucas MRN: [id number] Date of Birth: 06/09/1991

## 2017-11-12 ENCOUNTER — Ambulatory Visit: Payer: 59 | Admitting: Rehabilitative and Restorative Service Providers"

## 2017-11-12 ENCOUNTER — Encounter: Payer: 59 | Admitting: Rehabilitative and Restorative Service Providers"

## 2017-11-12 ENCOUNTER — Encounter: Payer: Self-pay | Admitting: Rehabilitative and Restorative Service Providers"

## 2017-11-12 DIAGNOSIS — R293 Abnormal posture: Secondary | ICD-10-CM

## 2017-11-12 DIAGNOSIS — M25511 Pain in right shoulder: Secondary | ICD-10-CM | POA: Diagnosis not present

## 2017-11-12 DIAGNOSIS — G8929 Other chronic pain: Secondary | ICD-10-CM | POA: Diagnosis not present

## 2017-11-12 DIAGNOSIS — R29898 Other symptoms and signs involving the musculoskeletal system: Secondary | ICD-10-CM | POA: Diagnosis not present

## 2017-11-12 NOTE — Therapy (Signed)
[facility] [place], [place], [address] Phone: [phone]   Fax:  [phone]  Physical Therapy Treatment  Patient Details  Name: Renee Lucas MRN: [id number] Date of Birth: Aug 30, 1991 Referring Provider: Eather Colas, PA-C   Encounter Date: 11/12/2017  PT End of Session - 11/12/17 1018    Visit Number  3    Number of Visits  12    Date for PT Re-Evaluation  12/03/17    PT Start Time  1017    PT Stop Time  1115    PT Time Calculation (min)  58 min    Activity Tolerance  Patient tolerated treatment well       History reviewed. No pertinent past medical history.  History reviewed. No pertinent surgical history.  There were no vitals filed for this visit.  Subjective Assessment - 11/12/17 1019    Subjective  On vacation for 10 days - noticed some increased pain with lifting luggage and travel. Felt good after last PT visit. Symtpoms may be a little better overall.     Currently in Pain?  No/denies         [facility] PT Assessment - 11/12/17 0001      Assessment   Medical Diagnosis  Rt shoulder pain     Referring Provider  Eather Colas, PA-C    Onset Date/Surgical Date  09/30/16    Hand Dominance  Right    Next MD Visit  PRN    Prior Therapy  none       Sensation   Additional Comments  no UE numbness       Posture/Postural Control   Posture Comments  head forward; shoulders rounded and elevated, Head of humerus anterior in orientation; scapulae abducted and rotated along the thoracic wall       AROM   Right Shoulder Flexion  166 Degrees    Right Shoulder ABduction  171 Degrees    Right Shoulder Internal Rotation  33 Degrees    Right Shoulder External Rotation  90 Degrees      Strength   Overall Strength Comments  5/5 bilat except Rt middle trap 5-/5 and lower trap 5-/5 and painful       Palpation   Spinal mobility  decreased mobilty thoracic spine     Palpation comment  persistent  tightness Rt > Lt upper trap. leveator, pecs, teres, biceps                   [facility] Adult PT Treatment/Exercise - 11/12/17 0001      Shoulder Exercises: Supine   Other Supine Exercises  chest lift 10 sec x 5 reps       Shoulder Exercises: Standing   Extension  Strengthening;Both;10 reps;Theraband    Theraband Level (Shoulder Extension)  Level 2 (Red)    Row  Strengthening;Both;10 reps;Theraband    Theraband Level (Shoulder Row)  Level 2 (Red)    Retraction  Strengthening;Both;10 reps;Theraband    Theraband Level (Shoulder Retraction)  Level 1 (Yellow)    Other Standing Exercises  axial extension 10 sec x 5; scap squeeze 10 sec x 10; L's x 10; W's x 10 all with swim noodle       Shoulder Exercises: Stretch   Other Shoulder Stretches  3 way doorway 30 sec x 3 reps each position     Other Shoulder Stretches  biceps stretch 30 sec x 3 standing       Moist Heat Therapy  Number Minutes Moist Heat  20 Minutes    Moist Heat Location  Shoulder Rt      Electrical Stimulation   Electrical Stimulation Location  Rt shoulder girdle = biceps tendon anteriorly/pecs/upper trap/posterior shoulder     Electrical Stimulation Action  IFC    Electrical Stimulation Parameters  to tolerance    Electrical Stimulation Goals  Pain;Tone      Iontophoresis   Type of Iontophoresis  Dexamethasone    Location  anterior Rt shoulder - biceps tendon area     Dose  120 mAmp    Time  10-12 hours       Manual Therapy   Manual therapy comments  pt supine     Joint Mobilization  GH jont circumduction     Soft tissue mobilization  deep tissue work through the pecs; teres; anterior shoulder; biceps; anterior deltoid on Rt     Myofascial Release  Rt pecs     Scapular Mobilization  Rt              PT Education - 11/12/17 1045    Education provided  Yes    Education Details  HEP     Person(s) Educated  Patient    Methods  Explanation;Demonstration;Tactile cues;Verbal cues;Handout     Comprehension  Verbalized understanding;Returned demonstration;Verbal cues required;Tactile cues required          PT Long Term Goals - 11/12/17 1018      PT LONG TERM GOAL #1   Title  Improve posture and alignment with patient to demonstrated good upright posture with posterior shoulder girdle musculature engaged 12/03/17    Time  6    Period  Weeks    Status  Partially Met      PT LONG TERM GOAL #2   Title  Increased pain free ROM Rt shoulder to equal Lt shoulder 12/03/17    Time  6    Period  Weeks    Status  Partially Met      PT LONG TERM GOAL #3   Title  Patient reports sleeping through the night without awakening due to pain 12/03/17    Time  6    Period  Weeks    Status  Partially Met      PT LONG TERM GOAL #4   Title  Independent in HEP 12/03/17    Time  6    Period  Weeks    Status  On-going      PT LONG TERM GOAL #5   Title  Improve FOTO to 23% limitation 12/03/17    Time  6    Period  Weeks    Status  On-going            Plan - 11/12/17 1046    Clinical Impression Statement  Progressing with improving posture and alignment; less palpable tightnss; increased AROM Rt shoulder; decreased pain. Added exercise without difficulty. Progressing well toward stated goals of therapy.     Rehab Potential  Good    PT Frequency  2x / week    PT Duration  6 weeks    PT Treatment/Interventions  Patient/family education;ADLs/Self Care Home Management;Cryotherapy;Electrical Stimulation;Iontophoresis 84m/ml Dexamethasone;Moist Heat;Ultrasound;Dry needling;Manual techniques;Neuromuscular re-education;Therapeutic activities;Therapeutic exercise    PT Next Visit Plan  continue manual work through the [employer] b[employer]/anterior deltoid; check pec stretch; add prolonged pec stretch with trunk rotation; continue ionto; modalities as indicated assess response to new exercises     Consulted and Agree with  Plan of Care  Patient       Patient will benefit from skilled  therapeutic intervention in order to improve the following deficits and impairments:  Postural dysfunction, Improper body mechanics, Pain, Impaired UE functional use, Increased muscle spasms, Increased fascial restricitons, Decreased mobility, Decreased range of motion, Decreased strength, Decreased activity tolerance  Visit Diagnosis: Chronic right shoulder pain  Other symptoms and signs involving the musculoskeletal system  Abnormal posture     Problem List There are no active problems to display for this patient.   [place], MPH  11/12/2017, 10:58 AM  [facility] [place], [place], [address] Phone: [phone]   Fax:  [phone]  Name: Renee Lucas MRN: [id number] Date of Birth: Apr 24, 1991

## 2017-11-12 NOTE — Patient Instructions (Addendum)
Thoracic Lift    Press shoulders down. Then lift mid-thoracic spine (area between the shoulder blades). Lift the breastbone slightly. Hold _5-10__ seconds. Relax. Repeat _10__ times.  Resisted External Rotation: in Neutral - Bilateral   PALMS UP Sit or stand, tubing in both hands, elbows at sides, bent to 90, forearms forward. Pinch shoulder blades together and rotate forearms out. Keep elbows at sides. Repeat __10__ times per set. Do _2-3___ sets per session. Do _1-2__ sessions per day.   Low Row: Standing   Face anchor, feet shoulder width apart. Palms up, pull arms back, squeezing shoulder blades together. Repeat 10__ times per set. Do 2-3__ sets per session. Do 1-2__ sessions per day Anchor Height: Waist   Strengthening: Resisted Extension   Hold tubing in right hand, arm forward. Pull arm back, elbow straight. Repeat _10___ times per set. Do 2-3____ sets per session. Do 1-2__ sessions per day.

## 2017-11-19 ENCOUNTER — Ambulatory Visit: Payer: 59 | Admitting: Rehabilitative and Restorative Service Providers"

## 2017-11-19 ENCOUNTER — Encounter: Payer: Self-pay | Admitting: Rehabilitative and Restorative Service Providers"

## 2017-11-19 DIAGNOSIS — G8929 Other chronic pain: Secondary | ICD-10-CM

## 2017-11-19 DIAGNOSIS — M25511 Pain in right shoulder: Secondary | ICD-10-CM | POA: Diagnosis not present

## 2017-11-19 DIAGNOSIS — R293 Abnormal posture: Secondary | ICD-10-CM | POA: Diagnosis not present

## 2017-11-19 DIAGNOSIS — R29898 Other symptoms and signs involving the musculoskeletal system: Secondary | ICD-10-CM

## 2017-11-19 NOTE — Patient Instructions (Addendum)
Stand facing wall - shoulder out to side at 90 degrees  Slowly turn body to left  Hold 30 sec 3 reps   Pectoralis Minor Stretch    Put hand behind head and elbow high on door jamb. Rotate body away from the fixed elbow. Bend the same-side knee to force upper arm into elevation and extension. Hold ___ seconds.  Repeat ___ times per session. Do ___ sessions per day.  Copyright  VHI. All rights reserved.  Flexors Stretch, Standing    Stand near wall and slide arm up, with palm facing away from wall, by leaning toward wall. Hold _30__ seconds.  Repeat __3_ times per session. Do _1-2__ sessions per day.   SUPINE Tips A    Being in the supine position means to be lying on the back. Lying on the back is the position of least compression on the bones and discs of the spine, and helps to re-align the natural curves of the back. Work toward 2-5 min  Add swim noodle along spine when this stretch becomes easy.   Lower Trunk Rotation Stretch    Keeping back flat and feet together, rotate knees to left side. Hold __20-30__ seconds. Repeat __2-3 __ times per set. Do ___2_ sessions per day.

## 2017-11-19 NOTE — Therapy (Addendum)
[facility] [place], [place], [address] Phone: [phone]   Fax:  [phone]  Physical Therapy Treatment  Patient Details  Name: Renee Lucas MRN: [id number] Date of Birth: Aug 11, 1991 Referring Provider: Eather Colas PA-C   Encounter Date: 11/19/2017    History reviewed. No pertinent past medical history.  History reviewed. No pertinent surgical history.  There were no vitals filed for this visit.  Subjective Assessment - 11/19/17 1437    Subjective  Less pain - only notices pain when she is taking her shirt off overhead. Exercises are going well at home     Currently in Pain?  No/denies         [facility] PT Assessment - 11/19/17 0001      Assessment   Medical Diagnosis  Rt shoulder pain     Referring Provider  Eather Colas PA-C    Onset Date/Surgical Date  09/30/16    Hand Dominance  Right    Next MD Visit  PRN    Prior Therapy  none       Observation/Other Assessments   Focus on Therapeutic Outcomes (FOTO)   31% limitation       Sensation   Additional Comments  no UE numbness       Posture/Postural Control   Posture Comments  improved posture       AROM   Overall AROM Comments  ROM is WNL's bilat       Strength   Overall Strength Comments  5/5 bilat       Palpation   Spinal mobility  WFL's     Palpation comment  tightness through the anterior shoulder at biceps tendon area                   [facility] Adult PT Treatment/Exercise - 11/19/17 0001      Shoulder Exercises: Standing   Extension  Strengthening;Both;10 reps;Theraband    Theraband Level (Shoulder Extension)  Level 3 (Green)    Row  Strengthening;Both;10 reps;Theraband added row at 45 deg green TB x 10     Theraband Level (Shoulder Row)  Level 3 (Green)    Retraction  Strengthening;Both;10 reps;Theraband    Theraband Level (Shoulder Retraction)  Level 2 (Red)    Other Standing Exercises  axial extension 10  sec x 5; scap squeeze 10 sec x 10; L's x 10; W's x 10 all with swim noodle       Shoulder Exercises: Stretch   Other Shoulder Stretches  3 way doorway 30 sec x 3 reps each position     Other Shoulder Stretches  biceps stretch 30 sec x 3 standing; horizontal abduction stretch at wall 30 sec x 2; shd flexion at wall 30 sec x 2       Moist Heat Therapy   Number Minutes Moist Heat  20 Minutes    Moist Heat Location  Shoulder Rt      Electrical Stimulation   Electrical Stimulation Location  Rt shoulder girdle = biceps tendon anteriorly/pecs/upper trap/posterior shoulder     Electrical Stimulation Action  IFC    Electrical Stimulation Parameters  to tolerance    Electrical Stimulation Goals  Pain;Tone      Iontophoresis   Type of Iontophoresis  Dexamethasone    Location  anterior Rt shoulder - biceps tendon area     Dose  120 mAmp    Time  10-12 hours       Manual Therapy  Manual therapy comments  pt supine     Joint Mobilization  [place] jont circumduction     Soft tissue mobilization  deep tissue work through the pecs; teres; anterior shoulder; biceps; anterior deltoid on Rt     Myofascial Release  Rt pecs     Scapular Mobilization  Rt              PT Education - 11/19/17 1452    Education provided  Yes    Education Details  HEP     Person(s) Educated  Patient    Methods  Explanation;Demonstration;Tactile cues;Verbal cues;Handout    Comprehension  Verbalized understanding;Returned demonstration;Verbal cues required;Tactile cues required          PT Long Term Goals - 11/19/17 1453      PT LONG TERM GOAL #1   Title  ,..    Time  6    Period  Weeks    Status  Achieved      PT LONG TERM GOAL #2   Title  Increased pain free ROM Rt shoulder to equal Lt shoulder 12/03/17    Time  6    Period  Weeks    Status  Achieved      PT LONG TERM GOAL #3   Title  Patient reports sleeping through the night without awakening due to pain 12/03/17    Time  6    Period  Weeks     Status  Achieved      PT LONG TERM GOAL #4   Title  Independent in HEP 12/03/17    Time  6    Period  Weeks    Status  Achieved      PT LONG TERM GOAL #5   Title  Improve FOTO to 23% limitation 12/03/17    Time  6    Period  Weeks    Status  Partially Met            Plan - 11/19/17 1514    Clinical Impression Statement  Excellent progress good ROM and strength. Only has painwith doffing shirt overhead - which was improved after today's treatment. Patient would like to try independent HEP. She will call to schedule as needed.     Rehab Potential  Good    PT Frequency  2x / week    PT Duration  6 weeks    PT Treatment/Interventions  Patient/family education;ADLs/Self Care Home Management;Cryotherapy;Electrical Stimulation;Iontophoresis '4mg'$ /ml Dexamethasone;Moist Heat;Ultrasound;Dry needling;Manual techniques;Neuromuscular re-education;Therapeutic activities;Therapeutic exercise    PT Next Visit Plan  patient will call to schedule as needed     Consulted and Agree with Plan of Care  Patient       Patient will benefit from skilled therapeutic intervention in order to improve the following deficits and impairments:  Postural dysfunction, Improper body mechanics, Pain, Impaired UE functional use, Increased muscle spasms, Increased fascial restricitons, Decreased mobility, Decreased range of motion, Decreased strength, Decreased activity tolerance  Visit Diagnosis: Chronic right shoulder pain  Other symptoms and signs involving the musculoskeletal system  Abnormal posture     Problem List There are no active problems to display for this patient.   [place], MPH  11/19/2017, 4:58 PM  [facility] [place], [place], [address] Phone: [phone]   Fax:  [phone]  Name: Renee Lucas MRN: [id number] Date of Birth: 1991/08/25  PHYSICAL THERAPY DISCHARGE SUMMARY  Visits from Start of Care:  4  Current functional  level related to goals / functional outcomes: See progress note for discharge status   Remaining deficits: Needs to continue with independent HEP    Education / Equipment: HEP  Plan: Patient agrees to discharge.  Patient goals were met. Patient is being discharged due to meeting the stated rehab goals.  ?????    Ajayla Iglesias P. Helene Kelp PT, MPH 12/03/17 12:18 PM

## 2018-01-28 DIAGNOSIS — Z01419 Encounter for gynecological examination (general) (routine) without abnormal findings: Secondary | ICD-10-CM | POA: Diagnosis not present

## 2018-01-28 DIAGNOSIS — Z6826 Body mass index (BMI) 26.0-26.9, adult: Secondary | ICD-10-CM | POA: Diagnosis not present

## 2018-02-18 IMAGING — MR MR SHOULDER*R* W/O CM
5 series · 40 of 40 positions shown · non-contrast
Comparison: None

CLINICAL DATA: Right shoulder pain for 1 year.

EXAM:
MRI OF THE RIGHT SHOULDER WITHOUT CONTRAST
TECHNIQUE: Multiplanar, multisequence MR imaging of the shoulder was performed.
No intravenous contrast was administered.

[Series 3: T2 fat-sat · axial · 4.0mm · 0.59mm/px · z∈[-34,+50]mm · 8 of 20 slices shown (1 of 3)]
[im 1/20]
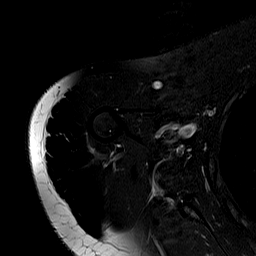
[im 3/20]
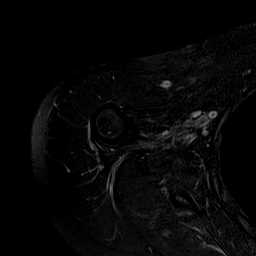
[im 6/20]
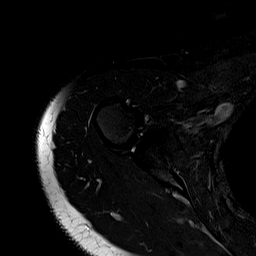
[im 9/20]
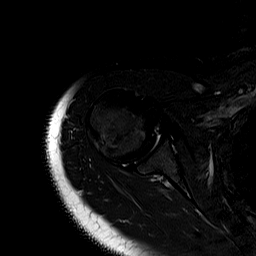
[im 11/20]
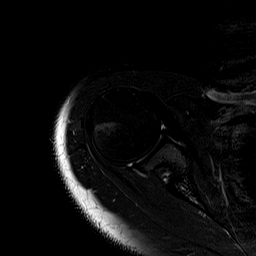
[im 14/20]
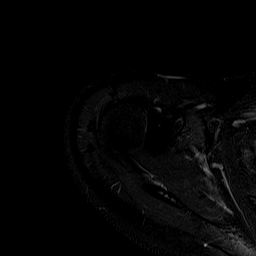
[im 17/20]
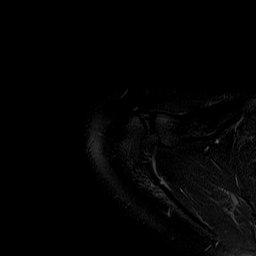
[im 20/20]
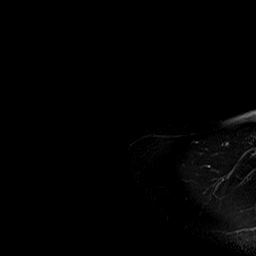

[Series 4: T2 fat-sat · oblique · 4.0mm · 0.59mm/px · 7 of 17 slices shown (2 of 3)]
[im 1/17]
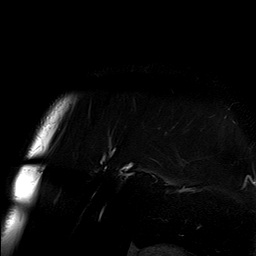
[im 3/17]
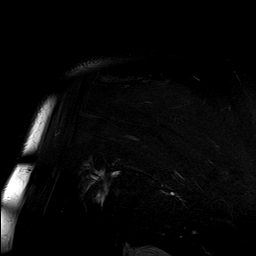
[im 6/17]
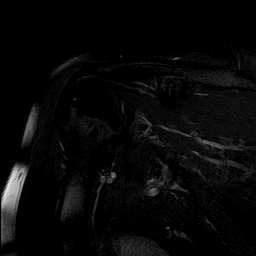
[im 9/17]
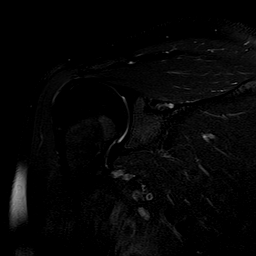
[im 11/17]
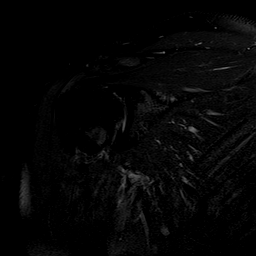
[im 14/17]
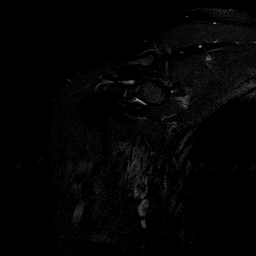
[im 17/17]
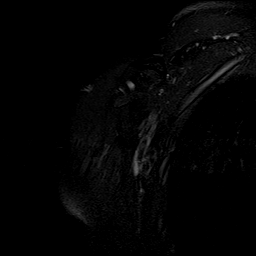

[Series 5: PD · oblique · 4.0mm · 0.59mm/px · 7 of 17 slices shown]
[im 1/17]
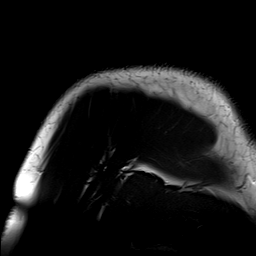
[im 3/17]
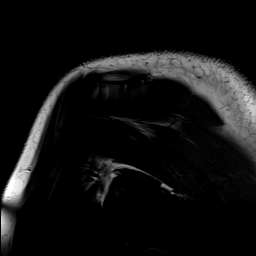
[im 6/17]
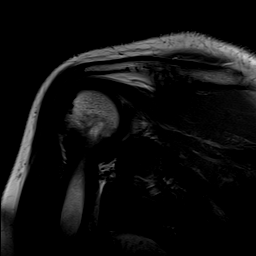
[im 9/17]
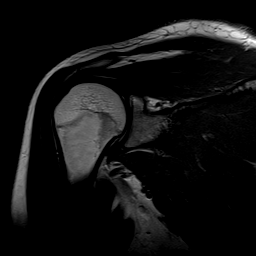
[im 11/17]
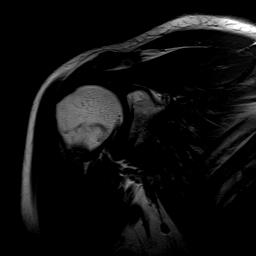
[im 14/17]
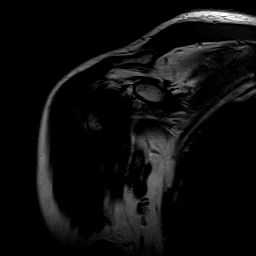
[im 17/17]
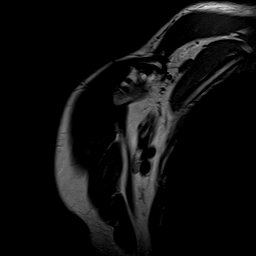

[Series 6: T1 · oblique · 4.0mm · 0.59mm/px · 9 of 20 slices shown]
[im 1/20]
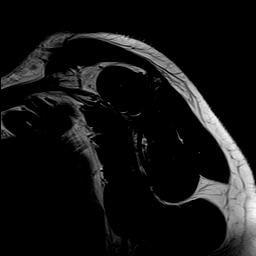
[im 3/20]
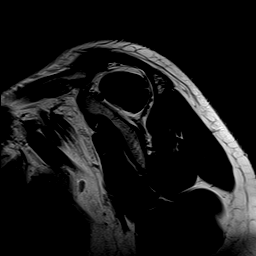
[im 5/20]
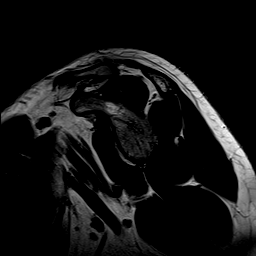
[im 8/20]
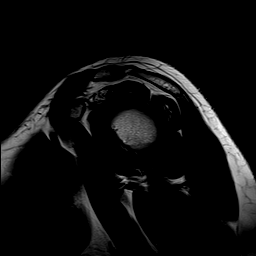
[im 10/20]
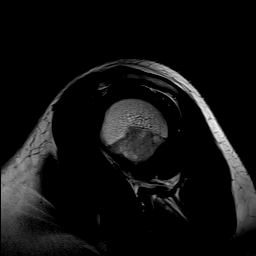
[im 12/20]
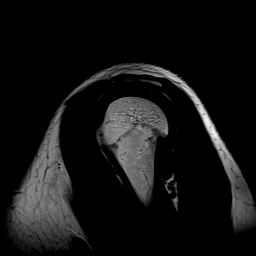
[im 15/20]
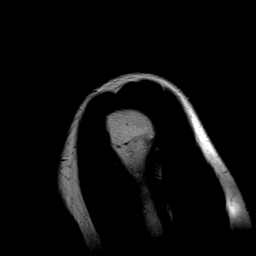
[im 17/20]
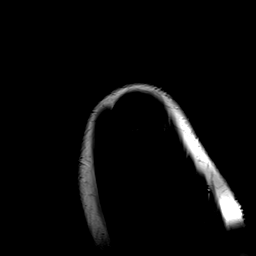
[im 20/20]
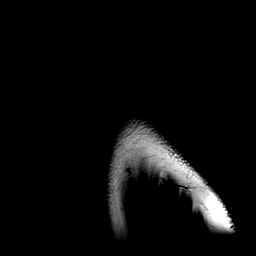

[Series 7: T2 fat-sat · oblique · 4.0mm · 0.59mm/px · 9 of 20 slices shown (3 of 3)]
[im 1/20]
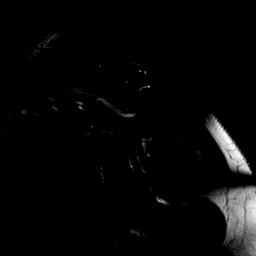
[im 3/20]
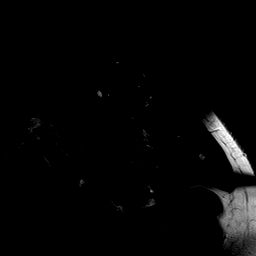
[im 5/20]
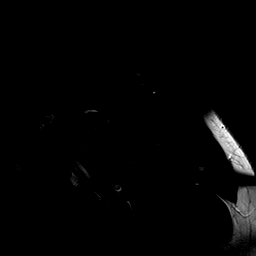
[im 8/20]
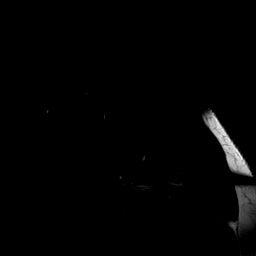
[im 10/20]
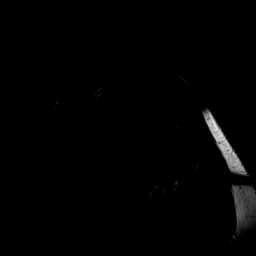
[im 12/20]
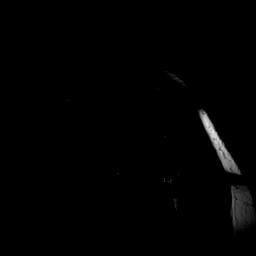
[im 15/20]
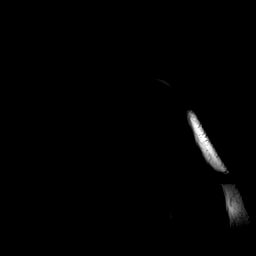
[im 17/20]
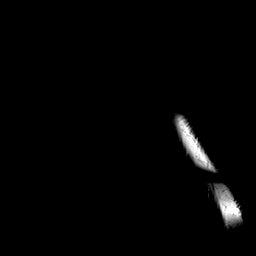
[im 20/20]
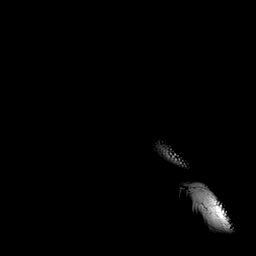

[40 of 40 positions shown; findings below may reference images not displayed]

FINDINGS: Rotator cuff: Mild rotator cuff tendinopathy/tendinosis with
interstitial tears. No partial or full-thickness rotator cuff tear.

Muscles:  Normal

Biceps long head:  Intact

Acromioclavicular Joint: No degenerative changes. Type 2 acromion.
No lateral downsloping or undersurface spurring.

Glenohumeral Joint: No degenerative changes or joint effusion. There
is thickening of the capsular structures in the axillary recess
which can be seen with adhesive capsulitis or synovitis.

Labrum:  No definite labral tears.

Bones:  No acute bony findings.

Other: No subacromial/subdeltoid fluid collections to suggest
bursitis.
IMPRESSION: 1. Mild rotator cuff tendinopathy/tendinosis but no partial or
full-thickness tear.
2. Intact long head biceps tendon and glenoid labrum.
3. No findings for bony impingement.
4. There is thickening of the capsular structures in the axillary
recess which can be seen with adhesive capsulitis or synovitis.

## 2018-03-05 ENCOUNTER — Other Ambulatory Visit: Payer: Self-pay | Admitting: Obstetrics and Gynecology

## 2018-03-05 MED ORDER — LEVONORGEST-ETH ESTRAD 91-DAY 0.1-0.02 & 0.01 MG PO TABS: 1.0000 | ORAL_TABLET | Freq: Every day | ORAL | 0 refills | Status: DC

## 2018-03-05 NOTE — Progress Notes (Signed)
Patient requests OCP's, to try and suppress her next cycle when she will be on Vacation. LMP today. Will start loseasonique, no contraindications, aware of risks. Call with any concerns.

## 2018-05-28 ENCOUNTER — Other Ambulatory Visit: Payer: Self-pay | Admitting: Obstetrics and Gynecology

## 2018-05-28 NOTE — Telephone Encounter (Signed)
Medication refill request: OCP Last Appointment 12/25/2016 Next AEX: not scheduled Last MMG (if hormonal medication request): n/a Refill authorized: #[id number], 3 refills, please adivse.

## 2018-05-28 NOTE — Telephone Encounter (Signed)
I will send in one refill. She needs an annual exam, she was doing this else where. Please ask her if she wants to do this here or with her other provider, but whoever is doing her routine care should continue with the scripts.

## 2018-05-28 NOTE — Telephone Encounter (Signed)
Call to patient. She does not take OCP. She thinks the pharmacy sent those on by mistake.  Will close encounter.

## 2018-10-04 ENCOUNTER — Encounter: Payer: Self-pay | Admitting: Emergency Medicine

## 2018-10-04 ENCOUNTER — Other Ambulatory Visit: Payer: Self-pay

## 2018-10-04 ENCOUNTER — Emergency Department (INDEPENDENT_AMBULATORY_CARE_PROVIDER_SITE_OTHER)
Admission: EM | Admit: 2018-10-04 | Discharge: 2018-10-04 | Disposition: A | Discharge status: Home / Self Care | Payer: 59 | Source: Home / Self Care | Attending: Internal Medicine | Admitting: Internal Medicine

## 2018-10-04 DIAGNOSIS — N3000 Acute cystitis without hematuria: Secondary | ICD-10-CM | POA: Diagnosis not present

## 2018-10-04 LAB — POCT URINALYSIS DIP (MANUAL ENTRY)
Bilirubin, UA: NEGATIVE
Blood, UA: NEGATIVE
Glucose, UA: NEGATIVE mg/dL
Ketones, POC UA: NEGATIVE mg/dL
Nitrite, UA: NEGATIVE
Protein Ur, POC: NEGATIVE mg/dL
Spec Grav, UA: 1.015 (ref 1.010–1.025)
Urobilinogen, UA: 0.2 E.U./dL
pH, UA: 7.5 (ref 5.0–8.0)

## 2018-10-04 MED ORDER — CEPHALEXIN 500 MG PO CAPS: 500.0000 mg | ORAL_CAPSULE | Freq: Two times a day (BID) | ORAL | 0 refills | Status: AC

## 2018-10-04 MED ORDER — FLUCONAZOLE 150 MG PO TABS: 150.0000 mg | ORAL_TABLET | Freq: Every day | ORAL | 1 refills | Status: DC

## 2018-10-04 NOTE — ED Provider Notes (Signed)
[facility]    CSN: [id number] Arrival date & time: 10/04/18  1235     History   Chief Complaint Chief Complaint  Patient presents with  . Dysuria    HPI Renee Lucas is a 28 y.o. female.   28 yo female with no chronic medical problems presents to urgent care complaining of dysuria x2 days.  She denies nausea, vomiting, fever or diarrhea.  Admits to frequency of urination and odor.      History reviewed. No pertinent past medical history.  There are no active problems to display for this patient.   History reviewed. No pertinent surgical history.  OB History    Gravida  0   Para  0   Term  0   Preterm  0   AB  0   Living  0     SAB  0   TAB  0   Ectopic  0   Multiple  0   Live Births  0            Home Medications    Prior to Admission medications   Medication Sig Start Date End Date Taking? Authorizing Provider  cephALEXin (KEFLEX) 500 MG capsule Take 1 capsule (500 mg total) by mouth 2 (two) times daily for 7 days. 10/04/18 10/11/18  Arnaldo Nataliamond, Ravyn Nikkel S, MD  fluconazole (DIFLUCAN) 150 MG tablet Take 1 tablet (150 mg total) by mouth daily. Take 1 tab today, then take second tablet after completing antibiotics 10/04/18   Arnaldo Nataliamond, Haniah Penny S, MD  Levonorgestrel-Ethinyl Estradiol (AMETHIA,CAMRESE) 0.1-0.02 & 0.01 MG tablet TAKE 1 TABLET BY MOUTH EVERY DAY 05/28/18   Romualdo BolkJertson, Jill Evelyn, MD  Levonorgestrel-Ethinyl Estradiol (LOSEASONIQUE) 0.1-0.02 & 0.01 MG tablet Take 1 tablet by mouth daily. Patient not taking: Reported on 10/22/2017 07/09/16   Romualdo BolkJertson, Jill Evelyn, MD  penicillin v potassium (VEETID) 500 MG tablet Take one tab by mouth twice daily for 10 days (Rx void after 05/31/17) Patient not taking: Reported on 10/22/2017 05/24/17   Lattie HawBeese, Stephen A, MD    Family History Family History  Problem Relation Age of Onset  . Cirrhosis Mother     Social History Social History   Tobacco Use  . Smoking status: Never Smoker  . Smokeless  tobacco: Never Used  Substance Use Topics  . Alcohol use: No  . Drug use: No     Allergies   Patient has no known allergies.   Review of Systems Review of Systems  Constitutional: Negative for chills and fever.  HENT: Negative for sore throat and tinnitus.   Eyes: Negative for redness.  Respiratory: Negative for cough and shortness of breath.   Cardiovascular: Negative for chest pain and palpitations.  Gastrointestinal: Negative for abdominal pain, diarrhea, nausea and vomiting.  Genitourinary: Positive for dysuria and frequency. Negative for urgency.  Musculoskeletal: Negative for myalgias.  Skin: Negative for rash.       No lesions  Neurological: Negative for weakness.  Hematological: Does not bruise/bleed easily.  Psychiatric/Behavioral: Negative for suicidal ideas.     Physical Exam Triage Vital Signs ED Triage Vitals  Enc Vitals Group     BP 10/04/18 1320 109/65     Pulse Rate 10/04/18 1320 79     Resp 10/04/18 1320 18     Temp 10/04/18 1320 98.1 F (36.7 C)     Temp Source 10/04/18 1320 Oral     SpO2 10/04/18 1320 100 %     Weight 10/04/18 1321 118 lb (  53.5 kg)     Height 10/04/18 1321 5\' 2"  (1.575 m)     Head Circumference --      Peak Flow --      Pain Score 10/04/18 1320 2     Pain Loc --      Pain Edu? --      Excl. in GC? --    No data found.  Updated Vital Signs BP 109/65 (BP Location: Right Arm)   Pulse 79   Temp 98.1 F (36.7 C) (Oral)   Resp 18   Ht 5\' 2"  (1.575 m)   Wt 53.5 kg   LMP 09/15/2018   SpO2 100%   BMI 21.58 kg/m   Visual Acuity Right Eye Distance:   Left Eye Distance:   Bilateral Distance:    Right Eye Near:   Left Eye Near:    Bilateral Near:     Physical Exam Vitals signs and nursing note reviewed.  Constitutional:      General: She is not in acute distress.    Appearance: She is well-developed.  HENT:     Head: Normocephalic and atraumatic.  Eyes:     General: No scleral icterus.    Conjunctiva/sclera:  Conjunctivae normal.     Pupils: Pupils are equal, round, and reactive to light.  Neck:     Musculoskeletal: Normal range of motion and neck supple.     Thyroid: No thyromegaly.     Vascular: No JVD.     Trachea: No tracheal deviation.  Cardiovascular:     Rate and Rhythm: Normal rate and regular rhythm.     Heart sounds: Normal heart sounds. No murmur. No friction rub. No gallop.   Pulmonary:     Effort: Pulmonary effort is normal.     Breath sounds: Normal breath sounds.  Abdominal:     General: Bowel sounds are normal. There is no distension.     Palpations: Abdomen is soft.     Tenderness: There is no abdominal tenderness.  Musculoskeletal: Normal range of motion.  Lymphadenopathy:     Cervical: No cervical adenopathy.  Skin:    General: Skin is warm and dry.  Neurological:     Mental Status: She is alert and oriented to person, place, and time.     Cranial Nerves: No cranial nerve deficit.  Psychiatric:        Behavior: Behavior normal.        Thought Content: Thought content normal.        Judgment: Judgment normal.      UC Treatments / Results  Labs (all labs ordered are listed, but only abnormal results are displayed) Labs Reviewed  URINE CULTURE  POCT URINALYSIS DIP (MANUAL ENTRY)    EKG None  Radiology No results found.  Procedures Procedures (including critical care time)  Medications Ordered in UC Medications - No data to display  Initial Impression / Assessment and Plan / UC Course  I have reviewed the triage vital signs and the nursing notes.  Pertinent labs & imaging results that were available during my care of the patient were reviewed by me and considered in my medical decision making (see chart for details).     UA clean. Will send for culture.  Treat as patient is symptomatic. No s/s of sepsis or pyelo.  She is not concerned about testing for STI.   Final Clinical Impressions(s) / UC Diagnoses   Final diagnoses:  Acute cystitis  without hematuria   Discharge Instructions  None    ED Prescriptions    Medication Sig Dispense Auth. Provider   cephALEXin (KEFLEX) 500 MG capsule Take 1 capsule (500 mg total) by mouth 2 (two) times daily for 7 days. 14 capsule Arnaldo Natal, MD   fluconazole (DIFLUCAN) 150 MG tablet Take 1 tablet (150 mg total) by mouth daily. Take 1 tab today, then take second tablet after completing antibiotics 2 tablet Arnaldo Natal, MD     Controlled Substance Prescriptions [place] Controlled Substance Registry consulted? Not Applicable   Arnaldo Natal, MD 10/04/18 [phone]

## 2018-10-04 NOTE — ED Triage Notes (Signed)
Patient reports malodorous urine with dysuria for past 2 days.

## 2018-10-06 ENCOUNTER — Telehealth: Payer: Self-pay | Admitting: *Deleted

## 2018-10-06 LAB — URINE CULTURE
MICRO NUMBER:: 14565
SPECIMEN QUALITY:: ADEQUATE

## 2018-10-06 MED ORDER — NITROFURANTOIN MONOHYD MACRO 100 MG PO CAPS: 100.0000 mg | ORAL_CAPSULE | Freq: Two times a day (BID) | ORAL | 0 refills | Status: DC

## 2018-10-06 NOTE — Telephone Encounter (Signed)
Spoke to pt given Ucx results. advised her to stop keflex and start Macrobid per Dr hopper. Call back if she has any questions or concerns.

## 2019-05-28 ENCOUNTER — Emergency Department (INDEPENDENT_AMBULATORY_CARE_PROVIDER_SITE_OTHER)
Admission: EM | Admit: 2019-05-28 | Discharge: 2019-05-28 | Disposition: A | Discharge status: Home / Self Care | Payer: 59 | Source: Home / Self Care | Attending: Family Medicine | Admitting: Family Medicine

## 2019-05-28 ENCOUNTER — Other Ambulatory Visit: Payer: Self-pay

## 2019-05-28 ENCOUNTER — Encounter: Payer: Self-pay | Admitting: Emergency Medicine

## 2019-05-28 DIAGNOSIS — N3090 Cystitis, unspecified without hematuria: Secondary | ICD-10-CM

## 2019-05-28 DIAGNOSIS — W57XXXA Bitten or stung by nonvenomous insect and other nonvenomous arthropods, initial encounter: Secondary | ICD-10-CM

## 2019-05-28 LAB — POCT URINALYSIS DIP (MANUAL ENTRY)
Bilirubin, UA: NEGATIVE
Glucose, UA: NEGATIVE mg/dL
Ketones, POC UA: NEGATIVE mg/dL
Nitrite, UA: POSITIVE — AB
Protein Ur, POC: NEGATIVE mg/dL
Spec Grav, UA: 1.015 (ref 1.010–1.025)
Urobilinogen, UA: 1 E.U./dL
pH, UA: 7 (ref 5.0–8.0)

## 2019-05-28 LAB — POCT URINE PREGNANCY: Preg Test, Ur: NEGATIVE

## 2019-05-28 MED ORDER — CEPHALEXIN 500 MG PO CAPS: 500.0000 mg | ORAL_CAPSULE | Freq: Two times a day (BID) | ORAL | 0 refills | Status: DC

## 2019-05-28 MED ORDER — PREDNISONE 20 MG PO TABS: ORAL_TABLET | ORAL | 0 refills | Status: DC

## 2019-05-28 NOTE — Discharge Instructions (Addendum)
Increase fluid intake. May use non-prescription AZO for about two days, if desired, to decrease urinary discomfort.  If symptoms become significantly worse during the night or over the weekend, proceed to the local emergency room.  

## 2019-05-28 NOTE — ED Triage Notes (Signed)
dYSURIA X 2 DAYS

## 2019-05-28 NOTE — ED Provider Notes (Signed)
[facility]    CSN: [id number] Arrival date & time: 05/28/19  1801      History   Chief Complaint Chief Complaint  Patient presents with  . Dysuria    HPI Renee Lucas is a 28 y.o. female.   Patient presents with two complaints. 1)  She has developed dysuria/frequency during the past two days but denies fevers, chills, and sweats or abdominal/pelvic pain.   2)  About 5 days ago she was hiking and later noticed pruritic bumps on her lower legs and behind knees that have persisted.  She believes that she may have had some type of insect bites. She reports that she is attempting to conceive.  Her last menstrual period was about 2 weeks ago.  The history is provided by the patient.  Dysuria Pain quality:  Burning Pain severity:  Mild Onset quality:  Sudden Duration:  2 days Timing:  Constant Progression:  Worsening Chronicity:  New Recent urinary tract infections: no   Relieved by:  None tried Worsened by:  Nothing Ineffective treatments:  None tried Urinary symptoms: frequent urination and hesitancy   Urinary symptoms: no discolored urine, no foul-smelling urine, no hematuria and no bladder incontinence   Associated symptoms: no abdominal pain, no fever, no flank pain, no genital lesions, no nausea, no vaginal discharge and no vomiting   Risk factors: sexually active   Risk factors: not pregnant   Rash Location: legs. Quality: dryness, itchiness and redness   Quality: not blistering, not bruising, not burning, not draining, not painful, not peeling, not scaling, not swelling and not weeping   Severity:  Mild Onset quality:  Sudden Duration:  5 days Timing:  Constant Progression:  Unchanged Chronicity:  New Context: insect bite/sting   Relieved by:  None tried Worsened by:  Nothing Ineffective treatments:  None tried Associated symptoms: no abdominal pain, no fever, no joint pain, no myalgias, no nausea and not vomiting     History reviewed. No  pertinent past medical history.  There are no active problems to display for this patient.   History reviewed. No pertinent surgical history.  OB History    Gravida  0   Para  0   Term  0   Preterm  0   AB  0   Living  0     SAB  0   TAB  0   Ectopic  0   Multiple  0   Live Births  0            Home Medications    Prior to Admission medications   Medication Sig Start Date End Date Taking? Authorizing Provider  cephALEXin (KEFLEX) 500 MG capsule Take 1 capsule (500 mg total) by mouth 2 (two) times daily. 05/28/19   Lattie Haw, MD  Levonorgestrel-Ethinyl Estradiol (AMETHIA,CAMRESE) 0.1-0.02 & 0.01 MG tablet TAKE 1 TABLET BY MOUTH EVERY DAY 05/28/18   Romualdo Bolk, MD  Levonorgestrel-Ethinyl Estradiol (LOSEASONIQUE) 0.1-0.02 & 0.01 MG tablet Take 1 tablet by mouth daily. Patient not taking: Reported on 10/22/2017 07/09/16   Romualdo Bolk, MD  predniSONE (DELTASONE) 20 MG tablet Take one tab by mouth twice daily for 4 days, then one daily. Take with food. 05/28/19   Lattie Haw, MD    Family History Family History  Problem Relation Age of Onset  . Cirrhosis Mother     Social History Social History   Tobacco Use  . Smoking status: Never Smoker  . Smokeless  tobacco: Never Used  Substance Use Topics  . Alcohol use: No  . Drug use: No     Allergies   Patient has no known allergies.   Review of Systems Review of Systems  Constitutional: Negative for fever.  Gastrointestinal: Negative for abdominal pain, nausea and vomiting.  Genitourinary: Positive for dysuria. Negative for flank pain and vaginal discharge.  Musculoskeletal: Negative for arthralgias and myalgias.  Skin: Positive for rash.  All other systems reviewed and are negative.    Physical Exam Triage Vital Signs ED Triage Vitals  Enc Vitals Group     BP 05/28/19 1907 114/73     Pulse Rate 05/28/19 1907 74     Resp --      Temp 05/28/19 1907 98.2 F (36.8 C)      Temp Source 05/28/19 1907 Oral     SpO2 05/28/19 1907 100 %     Weight 05/28/19 1908 117 lb (53.1 kg)     Height 05/28/19 1908 5\' 2"  (1.575 m)     Head Circumference --      Peak Flow --      Pain Score 05/28/19 1908 10     Pain Loc --      Pain Edu? --      Excl. in [place]? --    No data found.  Updated Vital Signs BP 114/73 (BP Location: Right Arm)   Pulse 74   Temp 98.2 F (36.8 C) (Oral)   Ht 5\' 2"  (1.575 m)   Wt 53.1 kg   SpO2 100%   BMI 21.40 kg/m   Visual Acuity Right Eye Distance:   Left Eye Distance:   Bilateral Distance:    Right Eye Near:   Left Eye Near:    Bilateral Near:     Physical Exam Vitals signs and nursing note reviewed.  Constitutional:      General: She is not in acute distress. HENT:     Head: Normocephalic.     Right Ear: External ear normal.     Left Ear: External ear normal.     Nose: Nose normal.     Mouth/Throat:     Pharynx: Oropharynx is clear.  Eyes:     Pupils: Pupils are equal, round, and reactive to light.  Cardiovascular:     Heart sounds: Normal heart sounds.  Pulmonary:     Breath sounds: Normal breath sounds.  Abdominal:     General: Abdomen is flat.     Tenderness: There is no abdominal tenderness.  Musculoskeletal:     Right lower leg: No edema.     Left lower leg: No edema.  Lymphadenopathy:     Cervical: No cervical adenopathy.  Skin:    General: Skin is warm and dry.          Comments: Posterior knees and lower legs above ankles have discrete erythematous macules without tenderness, swelling, or vesicles.  Neurological:     Mental Status: She is alert.      UC Treatments / Results  Labs (all labs ordered are listed, but only abnormal results are displayed) Labs Reviewed  POCT URINALYSIS DIP (MANUAL ENTRY) - Abnormal; Notable for the following components:      Result Value   Clarity, UA cloudy (*)    Blood, UA trace-lysed (*)    Nitrite, UA Positive (*)    Leukocytes, UA Moderate (2+) (*)    All  other components within normal limits  URINE CULTURE  POCT URINE PREGNANCY negative  EKG   Radiology No results found.  Procedures Procedures (including critical care time)  Medications Ordered in UC Medications - No data to display  Initial Impression / Assessment and Plan / UC Course  I have reviewed the triage vital signs and the nursing notes.  Pertinent labs & imaging results that were available during my care of the patient were reviewed by me and considered in my medical decision making (see chart for details).    Begin empiric Keflex.  Urine culture pending. Begin prednisone burst/taper. Followup with Family Doctor if not improved in one week.    Final Clinical Impressions(s) / UC Diagnoses   Final diagnoses:  Cystitis  Multiple insect bites     Discharge Instructions     Increase fluid intake.   May use non-prescription AZO for about two days, if desired, to decrease urinary discomfort.   If symptoms become significantly worse during the night or over the weekend, proceed to the local emergency room.     ED Prescriptions    Medication Sig Dispense Auth. Provider   cephALEXin (KEFLEX) 500 MG capsule Take 1 capsule (500 mg total) by mouth 2 (two) times daily. 14 capsule Lattie HawBeese, Rohail Klees A, MD   predniSONE (DELTASONE) 20 MG tablet Take one tab by mouth twice daily for 4 days, then one daily. Take with food. 12 tablet Lattie HawBeese, Channie Bostick A, MD        Lattie HawBeese, Dessirae Scarola A, MD 05/31/19 [id number]

## 2019-05-31 LAB — URINE CULTURE
MICRO NUMBER:: 827786
SPECIMEN QUALITY:: ADEQUATE

## 2019-06-01 ENCOUNTER — Telehealth (HOSPITAL_COMMUNITY): Payer: Self-pay | Admitting: Emergency Medicine

## 2019-06-01 NOTE — Telephone Encounter (Signed)
Urine culture was positive for e coli and was given keflex  at urgent care visit. Attempted to reach patient. No answer at this time.   

## 2019-07-07 DIAGNOSIS — Z6821 Body mass index (BMI) 21.0-21.9, adult: Secondary | ICD-10-CM | POA: Diagnosis not present

## 2019-07-07 DIAGNOSIS — Z01419 Encounter for gynecological examination (general) (routine) without abnormal findings: Secondary | ICD-10-CM | POA: Diagnosis not present

## 2019-10-01 NOTE — L&D Delivery Note (Signed)
Delivery Note At 12:52 PM a viable female was delivered via Vaginal, Spontaneous (Presentation: Right Occiput Anterior).  APGAR: 8, 9; weight  .   Placenta status: Spontaneous, Intact.  Cord: 3 vessels with the following complications: None.  Cord pH: not sent  Anesthesia: None Episiotomy: None Lacerations: Periurethral Suture Repair: vicryl 4'0 Est. Blood Loss (mL): 200   Its a boy  To be called "Renee Lucas"!!   Mom to postpartum.  Baby to Couplet care / Skin to Skin.  Renee Lucas 04/18/2020, 1:22 PM

## 2020-04-04 LAB — OB RESULTS CONSOLE GBS: GBS: NEGATIVE

## 2020-04-18 ENCOUNTER — Other Ambulatory Visit: Payer: Self-pay

## 2020-04-18 ENCOUNTER — Inpatient Hospital Stay (HOSPITAL_COMMUNITY)
Admission: AD | Admit: 2020-04-18 | Discharge: 2020-04-18 | Disposition: A | Discharge status: Home / Self Care | Payer: Managed Care, Other (non HMO) | Source: Home / Self Care | Attending: Obstetrics and Gynecology | Admitting: Obstetrics and Gynecology

## 2020-04-18 ENCOUNTER — Inpatient Hospital Stay (HOSPITAL_COMMUNITY)
Admission: AD | Admit: 2020-04-18 | Discharge: 2020-04-20 | DRG: 807 | Disposition: A | Discharge status: Home / Self Care | Payer: Managed Care, Other (non HMO) | Attending: Obstetrics and Gynecology | Admitting: Obstetrics and Gynecology

## 2020-04-18 ENCOUNTER — Encounter (HOSPITAL_COMMUNITY): Payer: Self-pay

## 2020-04-18 DIAGNOSIS — Z3A37 37 weeks gestation of pregnancy: Secondary | ICD-10-CM

## 2020-04-18 DIAGNOSIS — O4703 False labor before 37 completed weeks of gestation, third trimester: Secondary | ICD-10-CM | POA: Insufficient documentation

## 2020-04-18 DIAGNOSIS — Z20822 Contact with and (suspected) exposure to covid-19: Secondary | ICD-10-CM | POA: Diagnosis present

## 2020-04-18 DIAGNOSIS — O26893 Other specified pregnancy related conditions, third trimester: Secondary | ICD-10-CM | POA: Diagnosis present

## 2020-04-18 DIAGNOSIS — O479 False labor, unspecified: Secondary | ICD-10-CM

## 2020-04-18 HISTORY — DX: Other specified health status: Z78.9

## 2020-04-18 LAB — CBC
HCT: 41.7 % (ref 36.0–46.0)
Hemoglobin: 14.2 g/dL (ref 12.0–15.0)
MCH: 32.2 pg (ref 26.0–34.0)
MCHC: 34.1 g/dL (ref 30.0–36.0)
MCV: 94.6 fL (ref 80.0–100.0)
Platelets: 246 10*3/uL (ref 150–400)
RBC: 4.41 MIL/uL (ref 3.87–5.11)
RDW: 12.8 % (ref 11.5–15.5)
WBC: 17.7 10*3/uL — ABNORMAL HIGH (ref 4.0–10.5)
nRBC: 0 % (ref 0.0–0.2)

## 2020-04-18 LAB — SARS CORONAVIRUS 2 BY RT PCR (HOSPITAL ORDER, PERFORMED IN ~~LOC~~ HOSPITAL LAB): SARS Coronavirus 2: NEGATIVE

## 2020-04-18 LAB — ABO/RH: ABO/RH(D): O NEG

## 2020-04-18 LAB — TYPE AND SCREEN
ABO/RH(D): O NEG
Antibody Screen: POSITIVE

## 2020-04-18 MED ORDER — TETANUS-DIPHTH-ACELL PERTUSSIS 5-2.5-18.5 LF-MCG/0.5 IM SUSP: 0.5000 mL | Freq: Once | INTRAMUSCULAR | Status: DC

## 2020-04-18 MED ORDER — ACETAMINOPHEN 325 MG PO TABS: 650.0000 mg | ORAL_TABLET | ORAL | Status: DC | PRN

## 2020-04-18 MED ORDER — OXYTOCIN BOLUS FROM INFUSION
333.0000 mL | Freq: Once | INTRAVENOUS | Status: AC
  Administered 2020-04-18: 333 mL via INTRAVENOUS

## 2020-04-18 MED ORDER — OXYCODONE-ACETAMINOPHEN 5-325 MG PO TABS: 2.0000 | ORAL_TABLET | ORAL | Status: DC | PRN

## 2020-04-18 MED ORDER — LACTATED RINGERS IV SOLN: 500.0000 mL | INTRAVENOUS | Status: DC | PRN

## 2020-04-18 MED ORDER — DIPHENHYDRAMINE HCL 25 MG PO CAPS: 25.0000 mg | ORAL_CAPSULE | Freq: Four times a day (QID) | ORAL | Status: DC | PRN

## 2020-04-18 MED ORDER — PRENATAL MULTIVITAMIN CH
1.0000 | ORAL_TABLET | Freq: Every day | ORAL | Status: DC
  Administered 2020-04-19: 1 via ORAL
  Filled 2020-04-18: qty 1

## 2020-04-18 MED ORDER — ONDANSETRON HCL 4 MG PO TABS: 4.0000 mg | ORAL_TABLET | ORAL | Status: DC | PRN

## 2020-04-18 MED ORDER — IBUPROFEN 600 MG PO TABS
600.0000 mg | ORAL_TABLET | Freq: Four times a day (QID) | ORAL | Status: DC
  Administered 2020-04-18 – 2020-04-20 (×9): 600 mg via ORAL
  Filled 2020-04-18 (×10): qty 1

## 2020-04-18 MED ORDER — SOD CITRATE-CITRIC ACID 500-334 MG/5ML PO SOLN: 30.0000 mL | ORAL | Status: DC | PRN

## 2020-04-18 MED ORDER — DIBUCAINE (PERIANAL) 1 % EX OINT: 1.0000 "application " | TOPICAL_OINTMENT | CUTANEOUS | Status: DC | PRN

## 2020-04-18 MED ORDER — COCONUT OIL OIL
1.0000 "application " | TOPICAL_OIL | Status: DC | PRN
  Administered 2020-04-19: 1 via TOPICAL

## 2020-04-18 MED ORDER — OXYCODONE-ACETAMINOPHEN 5-325 MG PO TABS: 1.0000 | ORAL_TABLET | ORAL | Status: DC | PRN

## 2020-04-18 MED ORDER — LACTATED RINGERS IV SOLN: INTRAVENOUS | Status: DC

## 2020-04-18 MED ORDER — SENNOSIDES-DOCUSATE SODIUM 8.6-50 MG PO TABS
2.0000 | ORAL_TABLET | ORAL | Status: DC
  Administered 2020-04-18 – 2020-04-20 (×2): 2 via ORAL
  Filled 2020-04-18 (×2): qty 2

## 2020-04-18 MED ORDER — WITCH HAZEL-GLYCERIN EX PADS: 1.0000 "application " | MEDICATED_PAD | CUTANEOUS | Status: DC | PRN

## 2020-04-18 MED ORDER — LIDOCAINE HCL (PF) 1 % IJ SOLN: 30.0000 mL | INTRAMUSCULAR | Status: DC | PRN

## 2020-04-18 MED ORDER — ONDANSETRON HCL 4 MG/2ML IJ SOLN: 4.0000 mg | INTRAMUSCULAR | Status: DC | PRN

## 2020-04-18 MED ORDER — SIMETHICONE 80 MG PO CHEW: 80.0000 mg | CHEWABLE_TABLET | ORAL | Status: DC | PRN

## 2020-04-18 MED ORDER — OXYCODONE HCL 5 MG PO TABS: 10.0000 mg | ORAL_TABLET | ORAL | Status: DC | PRN

## 2020-04-18 MED ORDER — OXYCODONE HCL 5 MG PO TABS: 5.0000 mg | ORAL_TABLET | ORAL | Status: DC | PRN

## 2020-04-18 MED ORDER — ZOLPIDEM TARTRATE 5 MG PO TABS: 5.0000 mg | ORAL_TABLET | Freq: Every evening | ORAL | Status: DC | PRN

## 2020-04-18 MED ORDER — BENZOCAINE-MENTHOL 20-0.5 % EX AERO: 1.0000 "application " | INHALATION_SPRAY | CUTANEOUS | Status: DC | PRN

## 2020-04-18 MED ORDER — FLEET ENEMA 7-19 GM/118ML RE ENEM: 1.0000 | ENEMA | RECTAL | Status: DC | PRN

## 2020-04-18 MED ORDER — OXYTOCIN-SODIUM CHLORIDE 30-0.9 UT/500ML-% IV SOLN
2.5000 [IU]/h | INTRAVENOUS | Status: DC
  Filled 2020-04-18: qty 500

## 2020-04-18 MED ORDER — ONDANSETRON HCL 4 MG/2ML IJ SOLN: 4.0000 mg | Freq: Four times a day (QID) | INTRAMUSCULAR | Status: DC | PRN

## 2020-04-18 NOTE — Lactation Note (Signed)
This note was copied from a baby's chart. Lactation Consultation Note  Patient Name: Renee Lucas [id number] Date: 04/18/2020 Reason for consult: (P) Initial assessment;Early term 37-38.6wks Infant in crib on arrival.  [name] "Renee Lucas" now 7 hours old.  Mom reports it has been close to three hours since he fed so she was fixing to try and wake him.  Asked if I could hand him to her.  Mom agreed.  Mom took a breastfeeding class on line she reports. Unswaddled him and he got a little fussy.  Put STS with mom.  Infant had a hard time opening wide.  Laid mom back slightly and infant got more of an open mouth and latched on well.  Mom reports comfort.  Showed dad how to make sure cheeks and chin touching, in close, enough room to swallow and how to shape the breast right now especially since infant very stuffy.  Urged to feed on cue and 8-12 or more times day.  Got dad to take over assisting mom with holding breast and baby.  Infant still feeding past 20 minutes.   Reviewed understanding mother and baby.Reviewed infant yellow feeding and void/stool log/urged parents to keep track of it. Urged parerts to call Lactation as needed.      Akeisha Lagerquist S Kaylani Fromme 04/18/2020, 10:29 PM

## 2020-04-18 NOTE — H&P (Signed)
Renee Lucas is a 29 y.o. female presenting for active labor at 9.5cm dilation on arrival to [facility]. Pregnancy uncomplicated.. OB History    Gravida  1   Para  0   Term  0   Preterm  0   AB  0   Living  0     SAB  0   TAB  0   Ectopic  0   Multiple  0   Live Births  0          Past Medical History:  Diagnosis Date  . Medical history non-contributory    Past Surgical History:  Procedure Laterality Date  . NO PAST SURGERIES    . WISDOM TOOTH EXTRACTION     Family History: family history includes Cirrhosis in her mother. Social History:  reports that she has never smoked. She has never used smokeless tobacco. She reports that she does not drink alcohol and does not use drugs.     Maternal Diabetes: No Genetic Screening: Normal Maternal Ultrasounds/Referrals: Normal Fetal Ultrasounds or other Referrals:  None Maternal Substance Abuse:  No Significant Maternal Medications:  None Significant Maternal Lab Results:  None Other Comments:  None  Review of Systems History Dilation: 10 Effacement (%): 100 Station: Plus 1 Exam by:: Shanaya Schneck Blood pressure (!) 116/93, pulse 81, resp. rate 18. Exam Physical Exam  NAD, A&O NWOB Abd soft, nondistended, gravid  Prenatal labs: ABO, Rh: --/--/O NEG (07/20 1210) Antibody: PENDING (07/20 1210) Rubella:   RPR:    HBsAg:    HIV:    GBS: Negative/-- (07/06 0000)   Assessment/Plan:  29 yO G1P0 present in labor, 10cm. AROM for clear fluid. Push. GBS negative. Expecting a boy.     Ranae Pila 04/18/2020, 12:38 PM

## 2020-04-18 NOTE — Discharge Instructions (Signed)
Braxton Hicks Contractions °Contractions of the uterus can occur throughout pregnancy, but they are not always a sign that you are in labor. You may have practice contractions called Braxton Hicks contractions. These false labor contractions are sometimes confused with true labor. °What are Braxton Hicks contractions? °Braxton Hicks contractions are tightening movements that occur in the muscles of the uterus before labor. Unlike true labor contractions, these contractions do not result in opening (dilation) and thinning of the cervix. Toward the end of pregnancy (32-34 weeks), Braxton Hicks contractions can happen more often and may become stronger. These contractions are sometimes difficult to tell apart from true labor because they can be very uncomfortable. You should not feel embarrassed if you go to the hospital with false labor. °Sometimes, the only way to tell if you are in true labor is for your health care provider to look for changes in the cervix. The health care provider will do a physical exam and may monitor your contractions. If you are not in true labor, the exam should show that your cervix is not dilating and your water has not broken. °If there are no other health problems associated with your pregnancy, it is completely safe for you to be sent home with false labor. You may continue to have Braxton Hicks contractions until you go into true labor. °How to tell the difference between true labor and false labor °True labor °· Contractions last 30-70 seconds. °· Contractions become very regular. °· Discomfort is usually felt in the top of the uterus, and it spreads to the lower abdomen and low back. °· Contractions do not go away with walking. °· Contractions usually become more intense and increase in frequency. °· The cervix dilates and gets thinner. °False labor °· Contractions are usually shorter and not as strong as true labor contractions. °· Contractions are usually irregular. °· Contractions  are often felt in the front of the lower abdomen and in the groin. °· Contractions may go away when you walk around or change positions while lying down. °· Contractions get weaker and are shorter-lasting as time goes on. °· The cervix usually does not dilate or become thin. °Follow these instructions at home: ° °· Take over-the-counter and prescription medicines only as told by your health care provider. °· Keep up with your usual exercises and follow other instructions from your health care provider. °· Eat and drink lightly if you think you are going into labor. °· If Braxton Hicks contractions are making you uncomfortable: °? Change your position from lying down or resting to walking, or change from walking to resting. °? Sit and rest in a tub of warm water. °? Drink enough fluid to keep your urine pale yellow. Dehydration may cause these contractions. °? Do slow and deep breathing several times an hour. °· Keep all follow-up prenatal visits as told by your health care provider. This is important. °Contact a health care provider if: °· You have a fever. °· You have continuous pain in your abdomen. °Get help right away if: °· Your contractions become stronger, more regular, and closer together. °· You have fluid leaking or gushing from your vagina. °· You pass blood-tinged mucus (bloody show). °· You have bleeding from your vagina. °· You have low back pain that you never had before. °· You feel your baby’s head pushing down and causing pelvic pressure. °· Your baby is not moving inside you as much as it used to. °Summary °· Contractions that occur before labor are   called Braxton Hicks contractions, false labor, or practice contractions. °· Braxton Hicks contractions are usually shorter, weaker, farther apart, and less regular than true labor contractions. True labor contractions usually become progressively stronger and regular, and they become more frequent. °· Manage discomfort from Braxton Hicks contractions  by changing position, resting in a warm bath, drinking plenty of water, or practicing deep breathing. °This information is not intended to replace advice given to you by your health care provider. Make sure you discuss any questions you have with your health care provider. °Document Revised: 08/29/2017 Document Reviewed: 01/30/2017 °Elsevier Patient Education © 2020 [employer] ° °

## 2020-04-18 NOTE — [facility] Note (Signed)
Patient reports CTX < 5 mins apart.  Denies LOF/VB.  Endorses + FM.  Reports no complications w/ her pregnancy.  1 cm on last exam.

## 2020-04-18 NOTE — MAU Note (Signed)
I have communicated with Renee Lucas, CNM and reviewed vital signs:  Vitals:   04/18/20 0230 04/18/20 0409  BP: 124/74 119/81  Pulse: 69 70  Resp: 17 19  Temp: 98.1 F (36.7 C)     Vaginal exam:  Dilation: 3 Effacement (%): 80 Cervical Position: Anterior Station: -2 Presentation: Vertex Exam by:: Latricia Heft, RN,   Also reviewed contraction pattern and that non-stress test is reactive.  It has been documented that patient is contracting every 2-7 minutes with no cervical change over 1.5 hours not indicating active labor.  Patient denies any other complaints.  Based on this report provider has given order for discharge.  A discharge order and diagnosis entered by a provider.   Labor discharge instructions reviewed with patient.

## 2020-04-19 LAB — CBC
HCT: 34.5 % — ABNORMAL LOW (ref 36.0–46.0)
Hemoglobin: 11.7 g/dL — ABNORMAL LOW (ref 12.0–15.0)
MCH: 31.8 pg (ref 26.0–34.0)
MCHC: 33.9 g/dL (ref 30.0–36.0)
MCV: 93.8 fL (ref 80.0–100.0)
Platelets: 197 10*3/uL (ref 150–400)
RBC: 3.68 MIL/uL — ABNORMAL LOW (ref 3.87–5.11)
RDW: 13.1 % (ref 11.5–15.5)
WBC: 16.3 10*3/uL — ABNORMAL HIGH (ref 4.0–10.5)
nRBC: 0 % (ref 0.0–0.2)

## 2020-04-19 LAB — RPR: RPR Ser Ql: NONREACTIVE

## 2020-04-19 MED ORDER — RHO D IMMUNE GLOBULIN 1500 UNIT/2ML IJ SOSY
300.0000 ug | PREFILLED_SYRINGE | Freq: Once | INTRAMUSCULAR | Status: AC
  Administered 2020-04-19: 300 ug via INTRAMUSCULAR
  Filled 2020-04-19: qty 2

## 2020-04-19 NOTE — Lactation Note (Signed)
This note was copied from a baby's chart. Lactation Consultation Note  Patient Name: Renee Lucas [id number] Date: 04/19/2020 Reason for consult: Follow-up assessment;Primapara;1st time breastfeeding;Early term 37-38.6wks  Follow-up assessment of 30-hours [name], breastfeed exclusively with 4.61% weight loss. Mother is a primipara, first-time breastfeeding.   Baby is having a circumcision at this of visit. Mother reports baby is showing feeding cues and latching well. Mother states baby seems comfortable and sucks rhythmically. Baby is feeding every 2 -3 hours following cues and has had 3 voids and 2 stools. Mother mentions having a little pain at nipple in some occasions. Educated mom about deep latch to improve breastfeeding session and preventing nipple damage.   Discussed engorgement signs and what to expect with milk coming in. Reviewed resources available and encouraged to contact lactation services as needed for any questions or concerns.   Mother is aware to call lactation as needed.     Feeding Feeding Type: Breast Fed  Interventions Interventions: Breast feeding basics reviewed  Consult Status Consult Status: Follow-up Date: 04/20/20 Follow-up type: In-patient    Orvetta Danielski A Higuera Ancidey 04/19/2020, 7:22 PM

## 2020-04-19 NOTE — Progress Notes (Signed)
Post Partum Day 1 Subjective: no complaints, up ad lib, voiding and tolerating PO  Objective: Blood pressure 113/72, pulse 87, temperature 98.1 F (36.7 C), temperature source Oral, resp. rate 18, height 5\' 2"  (1.575 m), weight 70.1 kg, SpO2 99 %, unknown if currently breastfeeding.  Physical Exam:  General: alert, cooperative, appears stated age and no distress Lochia: appropriate Uterine Fundus: firm Incision: healing well DVT Evaluation: No evidence of DVT seen on physical exam.  Recent Labs    04/18/20 1213 04/19/20 0602  HGB 14.2 11.7*  HCT 41.7 34.5*    Assessment/Plan: Plan for discharge tomorrow, Breastfeeding and Circumcision prior to discharge   LOS: 1 day   04/21/20 04/19/2020, 9:46 AM

## 2020-04-20 LAB — RH IG WORKUP (INCLUDES ABO/RH)
ABO/RH(D): O NEG
Fetal Screen: NEGATIVE
Gestational Age(Wks): 37.6
Unit division: 0

## 2020-04-20 MED ORDER — IBUPROFEN 600 MG PO TABS: 600.0000 mg | ORAL_TABLET | Freq: Four times a day (QID) | ORAL | 0 refills | Status: DC | PRN

## 2020-04-20 MED ORDER — ACETAMINOPHEN 325 MG PO TABS: 650.0000 mg | ORAL_TABLET | Freq: Four times a day (QID) | ORAL | 0 refills | Status: DC | PRN

## 2020-04-20 NOTE — Lactation Note (Signed)
This note was copied from a baby's chart. Lactation Consultation Note   Patient Name: Renee Lucas Date: 04/20/2020 Reason for consult: Follow-up assessment   Mother paged for [facility] assistance . Mother breast feeding in cradle hold with infant swaddle tightly in blanket when LC arrived in the room.   Assist mother with rousing infant with STS and placing infant in cross cradle hold. Infant latched on with burst of audible swallows for several mins. Infants latch wide and lips flanged. Mother was taught to do breast compression. Mother reports that the latch is better than previously experienced. Infant sustained latch for 10 mins then tired out.   Infant roused and placed on alternate breast in football hold. Infant latched on with much better depth. Taught mother to bring infant on quickly. Infant was observed with a few audible swallows. Infant was given 10 ml of formula with a curved tip syringe at the breast.   Assist mother with pump parts to pump and LC fed infant the botte infant spit while feeding  most of the bottle . , infant took approx 15 ml of the bottle. 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 His tongue rolls upward when trying to latch with make for a difficult latch.   Plan of Care Breastfeed infant with feeding cues Supplement infant with ebm/formula, according to supplemental guidelines. Pump using a DEBP after each feeding for 15-20 mins.   Mother to continue to cue base feed infant and feed at least 8-12 times or more in 24 hours and advised to allow for cluster feeding infant as needed.   Mother to continue to due STS. Mother is aware of available LC services at [facility], [facility], OP Dept, and phone # for questions or concerns about breastfeeding.  Mother receptive to all teaching and plan of care.     Maternal Data Has patient been taught Hand Expression?: Yes  Feeding Feeding Type: Bottle Fed - Formula Nipple Type: Slow -  flow  LATCH Score Latch: Grasps breast easily, tongue down, lips flanged, rhythmical sucking.  Audible Swallowing: Spontaneous and intermittent  Type of Nipple: Everted at rest and after stimulation  Comfort (Breast/Nipple): Filling, red/small blisters or bruises, mild/mod discomfort  Hold (Positioning): Assistance needed to correctly position infant at breast and maintain latch.  LATCH Score: 8  Interventions Interventions: Breast compression;Adjust position;Support pillows;Position options;Hand pump;DEBP  Lactation Tools Discussed/Used     Consult Status Consult Status: Complete Date: 04/20/20 Follow-up type: In-patient    Stevan Born [facility] 04/20/2020, 12:02 PM

## 2020-04-20 NOTE — Lactation Note (Signed)
This note was copied from a baby's chart. Lactation Consultation Note  Patient Name: Renee Lucas [id number] Date: 04/20/2020 Reason for consult: Follow-up assessment   Mother reports that she offered infant formula due to his wt loss. Mother was teary when [facility] arrived in the room.   Encouragement and support given to mother.  Mother reports that breast are tender and they feel heavier.   Assist mother with hand expression. Observed that mother has classic door-knob nipples. LC has a concern that infant has not been getting the depth needed to transfer good volume. Mother has 5 ml ebm at the bedside that she pumped and hand expressed another 1 ml .  Suggested that mother page LC when infant wakes and is ready for his next feeding.   Mother to continue to post pump after each feeding as well as hand express. Mother has a Motif pump at home. Mother reports that she has good family support at home.  Discussed treatment and prevention of engorgement.   Mother to continue to cue base feed infant and feed at least 8-12 times or more in 24 hours and advised to allow for cluster feeding infant as needed.   Mother to continue to due STS. Mother is aware of available LC services at [facility], [facility], OP Dept, and phone # for questions or concerns about breastfeeding.  Mother receptive to all teaching and plan of care.   Maternal Data Has patient been taught Hand Expression?: Yes  Feeding Feeding Type:  (infant sleeping ) Nipple Type: Slow - flow  LATCH Score                   Interventions Interventions: Hand express;Expressed milk;Hand pump  Lactation Tools Discussed/Used     Consult Status Consult Status: Follow-up (mother to page for feeding assessment with next feeding. ) Date: 04/20/20 Follow-up type: In-patient    Stevan Born [facility] 04/20/2020, 9:47 AM

## 2020-04-20 NOTE — Discharge Summary (Signed)
Postpartum Discharge Summary  Date of Service updated 04/20/20     Patient Name: Renee Lucas DOB: 1991-05-27 MRN: [id number]  Date of admission: 04/18/2020 Delivery date:04/18/2020  Delivering provider: Tyson Dense  Date of discharge: 04/20/2020  Admitting diagnosis: Normal labor and delivery [O80] Intrauterine pregnancy: [redacted]w[redacted]d    Secondary diagnosis:  Active Problems:   Normal labor and delivery  Additional problems:     Discharge diagnosis: Term Pregnancy Delivered                                              Post partum procedures: Augmentation: N/A Complications: None  Hospital course: Onset of Labor With Vaginal Delivery      29y.o. yo G1P1001 at 325w6das admitted in Active Labor on 04/18/2020. Patient had an uncomplicated labor course as follows:  Membrane Rupture Time/Date: 12:27 PM ,04/18/2020   Delivery Method:Vaginal, Spontaneous  Episiotomy: None  Lacerations:  Periurethral  Patient had an uncomplicated postpartum course.  She is ambulating, tolerating a regular diet, passing flatus, and urinating well. Patient is discharged home in stable condition on 04/20/20.  Newborn Data: Birth date:04/18/2020  Birth time:12:52 PM  Gender:Female  Living status:Living  Apgars:8 ,9  Weight:2930 g   Magnesium Sulfate received: No BMZ received: No Rhophylac:No MMR:No T-DaP:Given prenatally Flu: No Transfusion:No  Physical exam  Vitals:   04/19/20 0603 04/19/20 1745 04/19/20 2100 04/20/20 0519  BP: 113/72 113/76 109/64 122/81  Pulse: 87 77 78 78  Resp: 18 17 16 18   Temp: 98.1 F (36.7 C) 98.7 F (37.1 C) 98.6 F (37 C) 98.4 F (36.9 C)  TempSrc: Oral Oral Oral Oral  SpO2: 99%   100%  Weight:      Height:       General: alert, cooperative and no distress Lochia: appropriate Uterine Fundus: firm Incision: Healing well with no significant drainage DVT Evaluation: No evidence of DVT seen on physical exam. Labs: Lab Results  Component Value Date    WBC 16.3 (H) 04/19/2020   HGB 11.7 (L) 04/19/2020   HCT 34.5 (L) 04/19/2020   MCV 93.8 04/19/2020   PLT 197 04/19/2020   No flowsheet data found. Edinburgh Score: Edinburgh Postnatal Depression Scale Screening Tool 04/19/2020  I have been able to laugh and see the funny side of things. 0  I have looked forward with enjoyment to things. 0  I have blamed myself unnecessarily when things went wrong. 1  I have been anxious or worried for no good reason. 1  I have felt scared or panicky for no good reason. 1  Things have been getting on top of me. 0  I have been so unhappy that I have had difficulty sleeping. 0  I have felt sad or miserable. 0  I have been so unhappy that I have been crying. 0  The thought of harming myself has occurred to me. 0  Edinburgh Postnatal Depression Scale Total 3      After visit meds:  Allergies as of 04/20/2020   No Known Allergies     Medication List    TAKE these medications   acetaminophen 325 MG tablet Commonly known as: Tylenol Take 2 tablets (650 mg total) by mouth every 6 (six) hours as needed (for pain scale < 4). What changed:   medication strength  how much to take  reasons to take this   ibuprofen 600 MG tablet Commonly known as: ADVIL Take 1 tablet (600 mg total) by mouth every 6 (six) hours as needed.   prenatal multivitamin Tabs tablet Take 1 tablet by mouth daily at 12 noon.        Discharge home in stable condition Infant Feeding: Breast Infant Disposition:home with mother Discharge instruction: per After Visit Summary and Postpartum booklet. Activity: Advance as tolerated. Pelvic rest for 6 weeks.  Diet: routine diet Anticipated Birth Control: Unsure Postpartum Appointment:6 weeks Additional Postpartum F/U:  Future Appointments:No future appointments. Follow up Visit:      04/20/2020 Allena Katz, MD

## 2021-09-30 NOTE — L&D Delivery Note (Signed)
Delivery Note At 6:59 AM a viable female was delivered via  (Presentation: ROA).  APGAR: 9, 9; weight pending.   Placenta status:  S, I. 3V Cord: loose nuchal x 1 with the following complications: none.  Cord pH: n/a  Anesthesia: Epidural Episiotomy: None Lacerations:  none Suture Repair:  n/a Est. Blood Loss (mL):  50 cc    Mom to postpartum.  Baby to Couplet care / Skin to Skin.  Linda Hedges 07/24/2022, 7:09 AM

## 2021-11-08 ENCOUNTER — Ambulatory Visit: Payer: Managed Care, Other (non HMO) | Admitting: Nurse Practitioner

## 2021-11-08 ENCOUNTER — Encounter: Payer: Self-pay | Admitting: Nurse Practitioner

## 2021-11-08 ENCOUNTER — Other Ambulatory Visit: Payer: Self-pay

## 2021-11-08 VITALS — BP 116/70

## 2021-11-08 DIAGNOSIS — N898 Other specified noninflammatory disorders of vagina: Secondary | ICD-10-CM

## 2021-11-08 DIAGNOSIS — B3731 Acute candidiasis of vulva and vagina: Secondary | ICD-10-CM

## 2021-11-08 LAB — WET PREP FOR TRICH, YEAST, CLUE

## 2021-11-08 MED ORDER — FLUCONAZOLE 150 MG PO TABS: 150.0000 mg | ORAL_TABLET | ORAL | 0 refills | Status: DC

## 2021-11-08 NOTE — Progress Notes (Signed)
° °  Acute Office Visit  Subjective:    Patient ID: Renee Lucas, female    DOB: 1990/10/30, 31 y.o.   MRN: [id number]   HPI 30 y.o. presents today for vaginal discharge that started a few weeks ago. Denies itching or odor.    Review of Systems  Constitutional: Negative.   Genitourinary:  Positive for vaginal discharge. Negative for vaginal pain.      Objective:    Physical Exam Constitutional:      Appearance: Normal appearance.  Genitourinary:    General: Normal vulva.     Vagina: Vaginal discharge present. No erythema.     Cervix: Normal.    BP 116/70    LMP 10/23/2021  Wt Readings from Last 3 Encounters:  04/18/20 154 lb 7 oz (70.1 kg)  04/18/20 154 lb 7 oz (70.1 kg)  05/28/19 117 lb (53.1 kg)   Wet prep + yeast     Assessment & Plan:   Problem List Items Addressed This Visit   None Visit Diagnoses     Vaginal candidiasis    -  Primary   Relevant Medications   fluconazole (DIFLUCAN) 150 MG tablet   Vaginal discharge       Relevant Orders   WET PREP FOR TRICH, YEAST, CLUE      Plan: Wet prep positive for yeast - Diflucan 150 mg today and repeat in 3 days if symptoms persist for total of 2 doses.     Olivia Mackie DNP, 12:18 PM 11/08/2021

## 2021-12-24 LAB — OB RESULTS CONSOLE RPR: RPR: NONREACTIVE

## 2021-12-24 LAB — OB RESULTS CONSOLE ABO/RH: RH Type: NEGATIVE

## 2021-12-24 LAB — HEPATITIS C ANTIBODY: HCV Ab: NEGATIVE

## 2021-12-24 LAB — OB RESULTS CONSOLE HEPATITIS B SURFACE ANTIGEN: Hepatitis B Surface Ag: NEGATIVE

## 2021-12-24 LAB — OB RESULTS CONSOLE ANTIBODY SCREEN: Antibody Screen: NEGATIVE

## 2021-12-24 LAB — OB RESULTS CONSOLE HIV ANTIBODY (ROUTINE TESTING): HIV: NONREACTIVE

## 2021-12-24 LAB — OB RESULTS CONSOLE RUBELLA ANTIBODY, IGM: Rubella: IMMUNE

## 2022-01-07 LAB — OB RESULTS CONSOLE GC/CHLAMYDIA
Chlamydia: NEGATIVE
Neisseria Gonorrhea: NEGATIVE

## 2022-07-05 LAB — OB RESULTS CONSOLE GBS: GBS: NEGATIVE

## 2022-07-24 ENCOUNTER — Inpatient Hospital Stay (HOSPITAL_COMMUNITY)
Admission: AD | Admit: 2022-07-24 | Discharge: 2022-07-25 | DRG: 807 | Disposition: A | Discharge status: Home / Self Care | Payer: Managed Care, Other (non HMO) | Attending: Obstetrics & Gynecology | Admitting: Obstetrics & Gynecology

## 2022-07-24 ENCOUNTER — Inpatient Hospital Stay (HOSPITAL_COMMUNITY): Payer: Managed Care, Other (non HMO) | Admitting: Anesthesiology

## 2022-07-24 ENCOUNTER — Encounter (HOSPITAL_COMMUNITY): Payer: Self-pay | Admitting: Obstetrics & Gynecology

## 2022-07-24 ENCOUNTER — Other Ambulatory Visit: Payer: Self-pay

## 2022-07-24 DIAGNOSIS — Z6791 Unspecified blood type, Rh negative: Secondary | ICD-10-CM | POA: Diagnosis not present

## 2022-07-24 DIAGNOSIS — Z3490 Encounter for supervision of normal pregnancy, unspecified, unspecified trimester: Secondary | ICD-10-CM

## 2022-07-24 DIAGNOSIS — Z3A39 39 weeks gestation of pregnancy: Secondary | ICD-10-CM | POA: Diagnosis not present

## 2022-07-24 DIAGNOSIS — O26893 Other specified pregnancy related conditions, third trimester: Secondary | ICD-10-CM | POA: Diagnosis present

## 2022-07-24 LAB — CBC
HCT: 39.6 % (ref 36.0–46.0)
Hemoglobin: 13.3 g/dL (ref 12.0–15.0)
MCH: 31.4 pg (ref 26.0–34.0)
MCHC: 33.6 g/dL (ref 30.0–36.0)
MCV: 93.6 fL (ref 80.0–100.0)
Platelets: 217 10*3/uL (ref 150–400)
RBC: 4.23 MIL/uL (ref 3.87–5.11)
RDW: 12.7 % (ref 11.5–15.5)
WBC: 14.5 10*3/uL — ABNORMAL HIGH (ref 4.0–10.5)
nRBC: 0 % (ref 0.0–0.2)

## 2022-07-24 LAB — TYPE AND SCREEN
ABO/RH(D): O NEG
Antibody Screen: POSITIVE

## 2022-07-24 LAB — RPR: RPR Ser Ql: NONREACTIVE

## 2022-07-24 MED ORDER — ONDANSETRON HCL 4 MG PO TABS: 4.0000 mg | ORAL_TABLET | ORAL | Status: DC | PRN

## 2022-07-24 MED ORDER — PHENYLEPHRINE 80 MCG/ML (10ML) SYRINGE FOR IV PUSH (FOR BLOOD PRESSURE SUPPORT): 80.0000 ug | PREFILLED_SYRINGE | INTRAVENOUS | Status: DC | PRN

## 2022-07-24 MED ORDER — COCONUT OIL OIL: 1.0000 | TOPICAL_OIL | Status: DC | PRN

## 2022-07-24 MED ORDER — IBUPROFEN 600 MG PO TABS
600.0000 mg | ORAL_TABLET | Freq: Four times a day (QID) | ORAL | Status: DC
  Administered 2022-07-24 – 2022-07-25 (×5): 600 mg via ORAL
  Filled 2022-07-24 (×5): qty 1

## 2022-07-24 MED ORDER — EPHEDRINE 5 MG/ML INJ: 10.0000 mg | INTRAVENOUS | Status: DC | PRN

## 2022-07-24 MED ORDER — PRENATAL MULTIVITAMIN CH
1.0000 | ORAL_TABLET | Freq: Every day | ORAL | Status: DC
  Administered 2022-07-24 – 2022-07-25 (×2): 1 via ORAL
  Filled 2022-07-24 (×2): qty 1

## 2022-07-24 MED ORDER — OXYCODONE-ACETAMINOPHEN 5-325 MG PO TABS: 2.0000 | ORAL_TABLET | ORAL | Status: DC | PRN

## 2022-07-24 MED ORDER — ONDANSETRON HCL 4 MG/2ML IJ SOLN: 4.0000 mg | Freq: Four times a day (QID) | INTRAMUSCULAR | Status: DC | PRN

## 2022-07-24 MED ORDER — ACETAMINOPHEN 325 MG PO TABS: 650.0000 mg | ORAL_TABLET | ORAL | Status: DC | PRN

## 2022-07-24 MED ORDER — FENTANYL-BUPIVACAINE-NACL 0.5-0.125-0.9 MG/250ML-% EP SOLN
EPIDURAL | Status: AC
  Filled 2022-07-24: qty 250

## 2022-07-24 MED ORDER — LACTATED RINGERS IV SOLN: INTRAVENOUS | Status: DC

## 2022-07-24 MED ORDER — FENTANYL-BUPIVACAINE-NACL 0.5-0.125-0.9 MG/250ML-% EP SOLN: 12.0000 mL/h | EPIDURAL | Status: DC | PRN

## 2022-07-24 MED ORDER — ONDANSETRON HCL 4 MG/2ML IJ SOLN: 4.0000 mg | INTRAMUSCULAR | Status: DC | PRN

## 2022-07-24 MED ORDER — LIDOCAINE HCL (PF) 1 % IJ SOLN
INTRAMUSCULAR | Status: DC | PRN
  Administered 2022-07-24: 10 mL via EPIDURAL
  Administered 2022-07-24: 2 mL via EPIDURAL

## 2022-07-24 MED ORDER — DIBUCAINE (PERIANAL) 1 % EX OINT: 1.0000 | TOPICAL_OINTMENT | CUTANEOUS | Status: DC | PRN

## 2022-07-24 MED ORDER — FLEET ENEMA 7-19 GM/118ML RE ENEM: 1.0000 | ENEMA | RECTAL | Status: DC | PRN

## 2022-07-24 MED ORDER — OXYCODONE-ACETAMINOPHEN 5-325 MG PO TABS: 1.0000 | ORAL_TABLET | ORAL | Status: DC | PRN

## 2022-07-24 MED ORDER — ZOLPIDEM TARTRATE 5 MG PO TABS: 5.0000 mg | ORAL_TABLET | Freq: Every evening | ORAL | Status: DC | PRN

## 2022-07-24 MED ORDER — LACTATED RINGERS IV SOLN: 500.0000 mL | Freq: Once | INTRAVENOUS | Status: DC

## 2022-07-24 MED ORDER — DIPHENHYDRAMINE HCL 50 MG/ML IJ SOLN: 12.5000 mg | INTRAMUSCULAR | Status: DC | PRN

## 2022-07-24 MED ORDER — FENTANYL-BUPIVACAINE-NACL 0.5-0.125-0.9 MG/250ML-% EP SOLN
EPIDURAL | Status: DC | PRN
  Administered 2022-07-24: 12 mL/h via EPIDURAL

## 2022-07-24 MED ORDER — SENNOSIDES-DOCUSATE SODIUM 8.6-50 MG PO TABS
2.0000 | ORAL_TABLET | Freq: Every day | ORAL | Status: DC
  Administered 2022-07-24 – 2022-07-25 (×2): 2 via ORAL
  Filled 2022-07-24 (×2): qty 2

## 2022-07-24 MED ORDER — WITCH HAZEL-GLYCERIN EX PADS
1.0000 | MEDICATED_PAD | CUTANEOUS | Status: DC | PRN
  Administered 2022-07-24: 1 via TOPICAL

## 2022-07-24 MED ORDER — ACETAMINOPHEN 325 MG PO TABS
650.0000 mg | ORAL_TABLET | ORAL | Status: DC | PRN
  Administered 2022-07-24: 650 mg via ORAL
  Filled 2022-07-24: qty 2

## 2022-07-24 MED ORDER — LIDOCAINE HCL (PF) 1 % IJ SOLN: 30.0000 mL | INTRAMUSCULAR | Status: DC | PRN

## 2022-07-24 MED ORDER — OXYTOCIN-SODIUM CHLORIDE 30-0.9 UT/500ML-% IV SOLN
2.5000 [IU]/h | INTRAVENOUS | Status: DC
  Administered 2022-07-24: 2.5 [IU]/h via INTRAVENOUS
  Filled 2022-07-24: qty 500

## 2022-07-24 MED ORDER — DIPHENHYDRAMINE HCL 25 MG PO CAPS: 25.0000 mg | ORAL_CAPSULE | Freq: Four times a day (QID) | ORAL | Status: DC | PRN

## 2022-07-24 MED ORDER — FENTANYL CITRATE (PF) 100 MCG/2ML IJ SOLN: 50.0000 ug | INTRAMUSCULAR | Status: DC | PRN

## 2022-07-24 MED ORDER — LACTATED RINGERS IV SOLN: 500.0000 mL | INTRAVENOUS | Status: DC | PRN

## 2022-07-24 MED ORDER — TETANUS-DIPHTH-ACELL PERTUSSIS 5-2.5-18.5 LF-MCG/0.5 IM SUSY: 0.5000 mL | PREFILLED_SYRINGE | Freq: Once | INTRAMUSCULAR | Status: DC

## 2022-07-24 MED ORDER — BENZOCAINE-MENTHOL 20-0.5 % EX AERO
1.0000 | INHALATION_SPRAY | CUTANEOUS | Status: DC | PRN
  Administered 2022-07-24: 1 via TOPICAL
  Filled 2022-07-24: qty 56

## 2022-07-24 MED ORDER — OXYTOCIN BOLUS FROM INFUSION
333.0000 mL | Freq: Once | INTRAVENOUS | Status: AC
  Administered 2022-07-24: 333 mL via INTRAVENOUS

## 2022-07-24 MED ORDER — SOD CITRATE-CITRIC ACID 500-334 MG/5ML PO SOLN: 30.0000 mL | ORAL | Status: DC | PRN

## 2022-07-24 MED ORDER — SIMETHICONE 80 MG PO CHEW: 80.0000 mg | CHEWABLE_TABLET | ORAL | Status: DC | PRN

## 2022-07-24 NOTE — Lactation Note (Signed)
This note was copied from a baby's chart. Lactation Consultation Note  Patient Name: Renee Lucas [id number] Date: 07/24/2022 Reason for consult: Initial assessment Age:32 hours  P2, Baby cueing. Assisted with latching in cradle hold.  Noted baby has short mid anterior lingual frenulum causing indention in infant's tongue.  Suggest discussing with Ped MD is mother becomes sore. Observed latch. After some guidance baby was able to achieve depth. Feed on demand with cues.  Goal 8-12+ times per day after first 24 hrs.  Place baby STS if not cueing.  Mom made aware of O/P services, breastfeeding support groupand our phone # for post-discharge questions.    Maternal Data Has patient been taught Hand Expression?: Yes Does the patient have breastfeeding experience prior to this delivery?: Yes  Feeding Mother's Current Feeding Choice: Breast Milk  LATCH Score Latch: Grasps breast easily, tongue down, lips flanged, rhythmical sucking.  Audible Swallowing: A few with stimulation  Type of Nipple: Everted at rest and after stimulation  Comfort (Breast/Nipple): Soft / non-tender  Hold (Positioning): Assistance needed to correctly position infant at breast and maintain latch.  LATCH Score: 8   Lactation Tools Discussed/Used    Interventions Interventions: Breast feeding basics reviewed;Assisted with latch;Education;LC Services brochure  Discharge    Consult Status Consult Status: Follow-up Date: 07/25/22 Follow-up type: In-patient    Vivianne Master [facility] 07/24/2022, 11:53 AM

## 2022-07-24 NOTE — H&P (Signed)
Renee Lucas is a 31 y.o. female G2P1001 at [redacted]w[redacted]d presenting for labor.  CTX started 24 hours ago but have intensified in last few hours.  No LOF or VB.  Antepartum course uncomplicated GBS negative.  OB History     Gravida  2   Para  1   Term  1   Preterm  0   AB  0   Living  1      SAB  0   IAB  0   Ectopic  0   Multiple  0   Live Births  1          Past Medical History:  Diagnosis Date   Medical history non-contributory    Past Surgical History:  Procedure Laterality Date   NO PAST SURGERIES     WISDOM TOOTH EXTRACTION     Family History: family history includes Cirrhosis in her mother. Social History:  reports that she has never smoked. She has never used smokeless tobacco. She reports that she does not drink alcohol and does not use drugs.     Maternal Diabetes: No Genetic Screening: Normal Maternal Ultrasounds/Referrals: Normal Fetal Ultrasounds or other Referrals:  None Maternal Substance Abuse:  No Significant Maternal Medications:  None Significant Maternal Lab Results:  Group B Strep negative and Rh negative Number of Prenatal Visits:greater than 3 verified prenatal visits Other Comments:  None  Review of Systems Maternal Medical History:  Reason for admission: Contractions.   Contractions: Onset was 13-24 hours ago.   Frequency: regular.   Perceived severity is moderate.   Fetal activity: Perceived fetal activity is normal.   Last perceived fetal movement was within the past hour.   Prenatal complications: no prenatal complications Prenatal Complications - Diabetes: none.   Dilation: 6 Effacement (%): 80 Station: -2 Exam by:: [employer], RN Blood pressure 106/67, pulse 81, temperature 98 F (36.7 C), temperature source Oral, resp. rate 18, height 5\' 2"  (1.575 m), weight 65.3 kg, last menstrual period 10/23/2021, SpO2 98 %, unknown if currently breastfeeding. Maternal Exam:  Uterine Assessment: Contraction strength is  moderate.  Contraction frequency is regular.  Abdomen: Patient reports no abdominal tenderness. Fundal height is c/w dates.   Estimated fetal weight is 7#8.     Fetal Exam Fetal Monitor Review: Baseline rate: 135.  Variability: moderate (6-25 bpm).   Pattern: accelerations present and no decelerations.   Fetal State Assessment: Category I - tracings are normal.   Physical Exam Constitutional:      Appearance: Normal appearance.  HENT:     Head: Normocephalic and atraumatic.  Pulmonary:     Effort: Pulmonary effort is normal.  Abdominal:     Palpations: Abdomen is soft.  Musculoskeletal:        General: Normal range of motion.     Cervical back: Normal range of motion.  Skin:    General: Skin is warm and dry.  Neurological:     Mental Status: She is alert and oriented to person, place, and time.  Psychiatric:        Mood and Affect: Mood normal.        Behavior: Behavior normal.     Prenatal labs: ABO, Rh: O/Negative/-- (03/27 0000) Antibody: Negative (03/27 0000) Rubella: Immune (03/27 0000) RPR: Nonreactive (03/27 0000)  HBsAg: Negative (03/27 0000)  HIV: Non-reactive (03/27 0000)  GBS: Negative/-- (10/06 0000)   Assessment/Plan: 31yo G2P1001 at [redacted]w[redacted]d with labor -Planning no CLEA -Anticipate NSVD   Linda Hedges 07/24/2022, 2:42  AM     

## 2022-07-24 NOTE — Anesthesia Procedure Notes (Signed)
Epidural Patient location during procedure: OB Start time: 07/24/2022 6:05 AM End time: 07/24/2022 6:13 AM  Staffing Anesthesiologist: Pervis Hocking, DO Performed: anesthesiologist   Preanesthetic Checklist Completed: patient identified, IV checked, risks and benefits discussed, monitors and equipment checked, pre-op evaluation and timeout performed  Epidural Patient position: sitting Prep: DuraPrep and site prepped and draped Patient monitoring: continuous pulse ox, blood pressure, heart rate and cardiac monitor Approach: midline Location: L3-L4 Injection technique: LOR air  Needle:  Needle type: Tuohy  Needle gauge: 17 G Needle length: 9 cm Needle insertion depth: 4.5 cm Catheter type: closed end flexible Catheter size: 19 Gauge Catheter at skin depth: 10 cm Test dose: negative  Assessment Sensory level: T8 Events: blood not aspirated, injection not painful, no injection resistance, no paresthesia and negative IV test  Additional Notes Patient identified. Risks/Benefits/Options discussed with patient including but not limited to bleeding, infection, nerve damage, paralysis, failed block, incomplete pain control, headache, blood pressure changes, nausea, vomiting, reactions to medication both or allergic, itching and postpartum back pain. Confirmed with bedside nurse the patient's most recent platelet count. Confirmed with patient that they are not currently taking any anticoagulation, have any bleeding history or any family history of bleeding disorders. Patient expressed understanding and wished to proceed. All questions were answered. Sterile technique was used throughout the entire procedure. Please see nursing notes for vital signs. Test dose was given through epidural catheter and negative prior to continuing to dose epidural or start infusion. Warning signs of high block given to the patient including shortness of breath, tingling/numbness in hands, complete motor  block, or any concerning symptoms with instructions to call for help. Patient was given instructions on fall risk and not to get out of bed. All questions and concerns addressed with instructions to call with any issues or inadequate analgesia.  Reason for block:procedure for pain

## 2022-07-24 NOTE — MAU Note (Signed)
.  Renee Lucas is a 31 y.o. at [redacted]w[redacted]d here in MAU reporting ctxs since 0230 Tues but stronger since 2330. Denies LOF or VB. Good FM. Last sve was last Fri and was 2cm   Onset of complaint: 2330 Pain score: 9 Vitals:   07/24/22 0146 07/24/22 0150  BP:  107/66  Pulse:  94  Resp: 18   Temp: 98.1 F (36.7 C)   SpO2:  99%     FHT:138 Lab orders placed from triage:  mau labor eval

## 2022-07-24 NOTE — Anesthesia Preprocedure Evaluation (Signed)
Anesthesia Evaluation  Patient identified by MRN, date of birth, ID band Patient awake    Reviewed: Allergy & Precautions, Patient's Chart, lab work & pertinent test results  Airway Mallampati: II  TM Distance: >3 FB Neck ROM: Full    Dental no notable dental hx.    Pulmonary neg pulmonary ROS,    Pulmonary exam normal breath sounds clear to auscultation       Cardiovascular negative cardio ROS Normal cardiovascular exam Rhythm:Regular Rate:Normal     Neuro/Psych negative neurological ROS  negative psych ROS   GI/Hepatic negative GI ROS, Neg liver ROS,   Endo/Other  negative endocrine ROS  Renal/GU negative Renal ROS  negative genitourinary   Musculoskeletal negative musculoskeletal ROS (+)   Abdominal   Peds negative pediatric ROS (+)  Hematology negative hematology ROS (+) Hb 13.3, plt 217   Anesthesia Other Findings   Reproductive/Obstetrics (+) Pregnancy                             Anesthesia Physical Anesthesia Plan  ASA: 2  Anesthesia Plan: Epidural   Post-op Pain Management:    Induction:   PONV Risk Score and Plan: 2  Airway Management Planned: Natural Airway  Additional Equipment: None  Intra-op Plan:   Post-operative Plan:   Informed Consent: I have reviewed the patients History and Physical, chart, labs and discussed the procedure including the risks, benefits and alternatives for the proposed anesthesia with the patient or authorized representative who has indicated his/her understanding and acceptance.       Plan Discussed with:   Anesthesia Plan Comments:         Anesthesia Quick Evaluation

## 2022-07-25 LAB — CBC
HCT: 33.9 % — ABNORMAL LOW (ref 36.0–46.0)
Hemoglobin: 12.1 g/dL (ref 12.0–15.0)
MCH: 33 pg (ref 26.0–34.0)
MCHC: 35.7 g/dL (ref 30.0–36.0)
MCV: 92.4 fL (ref 80.0–100.0)
Platelets: 159 10*3/uL (ref 150–400)
RBC: 3.67 MIL/uL — ABNORMAL LOW (ref 3.87–5.11)
RDW: 12.7 % (ref 11.5–15.5)
WBC: 10.3 10*3/uL (ref 4.0–10.5)
nRBC: 0 % (ref 0.0–0.2)

## 2022-07-25 MED ORDER — RHO D IMMUNE GLOBULIN 1500 UNIT/2ML IJ SOSY
300.0000 ug | PREFILLED_SYRINGE | Freq: Once | INTRAMUSCULAR | Status: AC
  Administered 2022-07-25: 300 ug via INTRAVENOUS
  Filled 2022-07-25: qty 2

## 2022-07-25 NOTE — Discharge Summary (Signed)
Postpartum Discharge Summary       Patient Name: Renee Lucas DOB: October 22, 1990 MRN: [id number]  Date of admission: 07/24/2022 Delivery date:07/24/2022  Delivering provider: Linda Hedges  Date of discharge: 07/25/2022  Admitting diagnosis: Normal labor [O80, Z37.9] Pregnancy [Z34.90] Intrauterine pregnancy: [redacted]w[redacted]d    Secondary diagnosis:  Principal Problem:   Normal labor Active Problems:   Pregnancy  Additional problems:      Discharge diagnosis: Term Pregnancy Delivered                                              Post partum procedures:   Augmentation: N/A Complications: None  Hospital course: Onset of Labor With Vaginal Delivery      31y.o. yo [id number] admitted in Active Labor on 07/24/2022.  Membrane Rupture Time/Date: 6:28 AM ,07/24/2022   Delivery Method:Vaginal, Spontaneous  Episiotomy: None  Lacerations:  None  Patient had a postpartum course complicated by none.  She is ambulating, tolerating a regular diet, passing flatus, and urinating well. Patient is discharged home in stable condition on 07/25/22.  Newborn Data: Birth date:07/24/2022  Birth time:6:59 AM  Gender:Female  Living status:Living  Apgars:9 ,9  Weight:2890 g   Magnesium Sulfate received: No BMZ received: No Rhophylac:Yes MMR:N/A T-DaP:Given prenatally Flu: N/A Transfusion:No  Physical exam  Vitals:   07/24/22 1457 07/24/22 1826 07/24/22 2204 07/25/22 0504  BP: 104/73 104/76 112/74 101/74  Pulse: 96 91 90 78  Resp: 18 14 17 16   Temp: 98.2 F (36.8 C) 98.9 F (37.2 C) 98.1 F (36.7 C) 97.9 F (36.6 C)  TempSrc: Oral Oral Oral Oral  SpO2: 98% 99%    Weight:      Height:       General: alert, cooperative, and no distress Lochia: appropriate Uterine Fundus: firm Incision: N/A DVT Evaluation: No evidence of DVT seen on physical exam. Labs: Lab Results  Component Value Date   WBC 10.3 07/25/2022   HGB 12.1 07/25/2022   HCT 33.9 (L) 07/25/2022   MCV 92.4  07/25/2022   PLT 159 07/25/2022       No data to display         Edinburgh Score:    07/24/2022    2:58 PM  Edinburgh Postnatal Depression Scale Screening Tool  I have been able to laugh and see the funny side of things. 0  I have looked forward with enjoyment to things. 0  I have blamed myself unnecessarily when things went wrong. 1  I have been anxious or worried for no good reason. 1  I have felt scared or panicky for no good reason. 0  Things have been getting on top of me. 1  I have been so unhappy that I have had difficulty sleeping. 0  I have felt sad or miserable. 0  I have been so unhappy that I have been crying. 0  The thought of harming myself has occurred to me. 0  Edinburgh Postnatal Depression Scale Total 3      After visit meds:  Allergies as of 07/25/2022   No Known Allergies      Medication List     TAKE these medications    acetaminophen 325 MG tablet Commonly known as: Tylenol Take 2 tablets (650 mg total) by mouth every 6 (six) hours as needed (for pain scale <  4).   fluconazole 150 MG tablet Commonly known as: DIFLUCAN Take 1 tablet (150 mg total) by mouth every 3 (three) days.   ibuprofen 600 MG tablet Commonly known as: ADVIL Take 1 tablet (600 mg total) by mouth every 6 (six) hours as needed.   multivitamin-prenatal 27-0.8 MG Tabs tablet Take 1 tablet by mouth daily at 12 noon.         Discharge home in stable condition Infant Feeding: Breast Infant Disposition:home with mother Discharge instruction: per After Visit Summary and Postpartum booklet. Activity: Advance as tolerated. Pelvic rest for 6 weeks.  Diet: routine diet Anticipated Birth Control: Unsure Postpartum Appointment:6 weeks Additional Postpartum F/U:    Future Appointments:No future appointments. Follow up Visit:      07/25/2022 Luz Lex, MD

## 2022-07-25 NOTE — Lactation Note (Signed)
This note was copied from a baby's chart. Lactation Consultation Note  Patient Name: Renee Lucas VVOHY'[id number] Date: 07/25/2022 Reason for consult: Follow-up assessment Age:32 hours  Baby nursing onto nipple only when LC entered.  Pillows provided for better support.  Parent feels baby is constantly wanting to nurse even after latching.   When observing latch, has visible lingual frenulum that prevents elevation of the tongue to the palate.    At the breast, baby had multiple sucks but with few swallows.  Mom worked to get baby latched more deeply and swallows were heard using breast compressions.  Mom is able to now hand express and drops easily flow.  Manual pump provided and encouraged parent to use hand pump and feed any milk back to baby.  Parent given restriction resource sheet and encouraged to ask provider about possible anatomical restriction.    If parent stays another night, LC encouraged parent to begin using DEBP after feedings to collect milk to feed back to baby.  RN aware of plan to begin pumping if mom stays. Maternal Data Has patient been taught Hand Expression?: Yes  Feeding Mother's Current Feeding Choice: Breast Milk  LATCH Score Latch: Repeated attempts needed to sustain latch, nipple held in mouth throughout feeding, stimulation needed to elicit sucking reflex.  Audible Swallowing: A few with stimulation (compressions needed to encourage swallows, chomping noted)  Type of Nipple: Everted at rest and after stimulation  Comfort (Breast/Nipple): Filling, red/small blisters or bruises, mild/mod discomfort  Hold (Positioning): Assistance needed to correctly position infant at breast and maintain latch.  LATCH Score: 6   Lactation Tools Discussed/Used    Interventions Interventions: Breast feeding basics reviewed;Assisted with latch;Skin to skin;Hand express;Adjust position;Support pillows;Position options  Discharge Discharge Education: Warning signs  for feeding baby Pump: Manual  Consult Status Consult Status: Follow-up Date: 07/26/22 Follow-up type: In-patient    Ferne Coe [facility] 07/25/2022, 2:00 PM

## 2022-07-25 NOTE — Anesthesia Postprocedure Evaluation (Signed)
Anesthesia Post Note  Patient: [name]  Procedure(s) Performed: AN AD HOC LABOR EPIDURAL     Patient location during evaluation: Mother Baby Anesthesia Type: Epidural Level of consciousness: awake and alert Pain management: pain level controlled Vital Signs Assessment: post-procedure vital signs reviewed and stable Respiratory status: spontaneous breathing, nonlabored ventilation and respiratory function stable Cardiovascular status: stable Postop Assessment: no headache, no backache and epidural receding Anesthetic complications: no   No notable events documented.  Last Vitals:  Vitals:   07/24/22 2204 07/25/22 0504  BP: 112/74 101/74  Pulse: 90 78  Resp: 17 16  Temp: 36.7 C 36.6 C  SpO2:      Last Pain:  Vitals:   07/25/22 0504  TempSrc: Oral  PainSc:    Pain Goal: Patients Stated Pain Goal: 0 (07/24/22 0153)                 Barkley Boards

## 2022-07-26 LAB — RH IG WORKUP (INCLUDES ABO/RH)
Fetal Screen: NEGATIVE
Gestational Age(Wks): 39
Unit division: 0

## 2022-08-05 ENCOUNTER — Telehealth (HOSPITAL_COMMUNITY): Payer: Self-pay | Admitting: *Deleted

## 2022-08-05 NOTE — Telephone Encounter (Signed)
Mom reports feeling good. No concerns about herself at this time. EPDS=3 [facility] score=3) Mom reports baby is doing well. Feeding, peeing, and pooping without difficulty. Safe sleep reviewed. Mom reports no concerns about baby at present.  Odis Hollingshead, RN 08-05-2022 at 10:16am

## 2024-06-08 ENCOUNTER — Ambulatory Visit
Admission: EM | Admit: 2024-06-08 | Discharge: 2024-06-08 | Disposition: A | Discharge status: Home / Self Care | Attending: Family Medicine | Admitting: Family Medicine

## 2024-06-08 DIAGNOSIS — J029 Acute pharyngitis, unspecified: Secondary | ICD-10-CM | POA: Diagnosis not present

## 2024-06-08 DIAGNOSIS — J069 Acute upper respiratory infection, unspecified: Secondary | ICD-10-CM | POA: Diagnosis not present

## 2024-06-08 LAB — POCT RAPID STREP A (OFFICE): Rapid Strep A Screen: NEGATIVE

## 2024-06-08 LAB — POC SARS CORONAVIRUS 2 AG -  ED: SARS Coronavirus 2 Ag: NEGATIVE

## 2024-06-08 NOTE — ED Provider Notes (Signed)
 Renee Lucas [facility]    CSN: [id number] Arrival date & time: 06/08/24  1711      History   Chief Complaint Chief Complaint  Patient presents with   Sore Throat   Nasal Congestion   Headache    HPI Renee Lucas is a 33 y.o. female.   HPI Patient states she has a 61-year-old and an almost 37-year-old at home.  The 50-year-old brought home and infection from pre-k.  She states they will have cold runny nose and sore throat.  The children went to the pediatrician.  They were found to have ear infections.  No testing was done.  Patient is here requesting strep and COVID testing.  She has runny stuffy nose, sore throat, and ear pain for headache and tiredness.  Past Medical History:  Diagnosis Date   Medical history non-contributory     Patient Active Problem List   Diagnosis Date Noted   Normal labor 07/24/2022   Pregnancy 07/24/2022   Normal labor and delivery 04/18/2020    Past Surgical History:  Procedure Laterality Date   NO PAST SURGERIES     WISDOM TOOTH EXTRACTION      OB History     Gravida  2   Para  2   Term  2   Preterm  0   AB  0   Living  2      SAB  0   IAB  0   Ectopic  0   Multiple  0   Live Births  2            Home Medications    Prior to Admission medications   Medication Sig Start Date End Date Taking? Authorizing Provider  Prenatal Vit-Fe Fumarate-FA (MULTIVITAMIN-PRENATAL) 27-0.8 MG TABS tablet Take 1 tablet by mouth daily at 12 noon.    [provider]    Family History Family History  Problem Relation Age of Onset   Cirrhosis Mother     Social History Social History   Tobacco Use   Smoking status: Never   Smokeless tobacco: Never  Vaping Use   Vaping status: Never Used  Substance Use Topics   Alcohol use: No   Drug use: No     Allergies   Patient has no known allergies.   Review of Systems Review of Systems  See HPI Physical Exam Triage Vital Signs ED Triage Vitals [06/08/24  1721]  Encounter Vitals Group     BP 116/75     Girls Systolic BP Percentile      Girls Diastolic BP Percentile      Boys Systolic BP Percentile      Boys Diastolic BP Percentile      Pulse Rate (!) 102     Resp 17     Temp 98.8 F (37.1 C)     Temp Source Oral     SpO2 98 %     Weight      Height      Head Circumference      Peak Flow      Pain Score 7     Pain Loc      Pain Education      Exclude from Growth Chart    No data found.  Updated Vital Signs BP 116/75 (BP Location: Right Arm)   Pulse (!) 102   Temp 98.8 F (37.1 C) (Oral)   Resp 17   LMP 06/03/2024 (Exact Date)   SpO2 98%   Breastfeeding  No      Physical Exam Constitutional:      General: She is not in acute distress.    Appearance: She is well-developed and normal weight.  HENT:     Head: Normocephalic and atraumatic.     Right Ear: Tympanic membrane normal.     Left Ear: Tympanic membrane normal.     Nose: Congestion and rhinorrhea present.     Mouth/Throat:     Mouth: Mucous membranes are moist.     Pharynx: Uvula midline. Posterior oropharyngeal erythema present. No pharyngeal swelling.     Tonsils: No tonsillar exudate. 1+ on the right. 1+ on the left.  Eyes:     Conjunctiva/sclera: Conjunctivae normal.     Pupils: Pupils are equal, round, and reactive to light.  Cardiovascular:     Rate and Rhythm: Normal rate and regular rhythm.     Heart sounds: Normal heart sounds.  Pulmonary:     Effort: Pulmonary effort is normal. No respiratory distress.     Breath sounds: Normal breath sounds.  Abdominal:     General: There is no distension.     Palpations: Abdomen is soft.  Musculoskeletal:        General: Normal range of motion.     Cervical back: Normal range of motion.  Lymphadenopathy:     Cervical: No cervical adenopathy.  Skin:    General: Skin is warm and dry.  Neurological:     Mental Status: She is alert.      UC Treatments / Results  Labs (all labs ordered are listed, but  only abnormal results are displayed) Labs Reviewed  POC SARS CORONAVIRUS 2 AG -  ED  POCT RAPID STREP A (OFFICE)    EKG   Radiology No results found.  Procedures Procedures (including critical care time)  Medications Ordered in UC Medications - No data to display  Initial Impression / Assessment and Plan / UC Course  I have reviewed the triage vital signs and the nursing notes.  Pertinent labs & imaging results that were available during my care of the patient were reviewed by me and considered in my medical decision making (see chart for details).     Discussed home care for viral illness Final Clinical Impressions(s) / UC Diagnoses   Final diagnoses:  Sore throat  Viral URI with cough     Discharge Instructions      Rest, fluids, Tylenol  May take over-the-counter cough cold medicine as needed Call for problems     ED Prescriptions   None    PDMP not reviewed this encounter.   Maranda Jamee Jacob, MD 06/08/24 [phone]

## 2024-06-08 NOTE — ED Triage Notes (Addendum)
 Pt c/o nasal congestion, sore throat and RT ear pain since yesterday. Some nausea. No known fever. Taking OTC cold meds prn. Kids are sick as well, took to peds yesterday. No testing done, dx with ear infections.

## 2024-06-08 NOTE — Discharge Instructions (Signed)
 Rest, fluids, Tylenol  May take over-the-counter cough cold medicine as needed Call for problems

## 2024-10-15 ENCOUNTER — Ambulatory Visit
Admission: RE | Admit: 2024-10-15 | Discharge: 2024-10-15 | Disposition: A | Discharge status: Home / Self Care | Attending: Family Medicine | Admitting: Family Medicine

## 2024-10-15 VITALS — BP 113/75 | HR 76

## 2024-10-15 DIAGNOSIS — R300 Dysuria: Secondary | ICD-10-CM | POA: Diagnosis not present

## 2024-10-15 LAB — POCT URINALYSIS DIP (MANUAL ENTRY)
Bilirubin, UA: NEGATIVE
Glucose, UA: NEGATIVE mg/dL
Ketones, POC UA: NEGATIVE mg/dL
Nitrite, UA: NEGATIVE
Protein Ur, POC: NEGATIVE mg/dL
Spec Grav, UA: 1.01
Urobilinogen, UA: 0.2 U/dL
pH, UA: 6.5

## 2024-10-15 MED ORDER — CEPHALEXIN 500 MG PO CAPS: 500.0000 mg | ORAL_CAPSULE | Freq: Two times a day (BID) | ORAL | 0 refills | Status: AC

## 2024-10-15 NOTE — ED Provider Notes (Signed)
 " Renee Lucas    CSN: [id number] Arrival date & time: 10/15/24  1834      History   Chief Complaint Chief Complaint  Patient presents with   Dysuria    Entered by patient   Abdominal Pain    HPI Renee Lucas is a 34 y.o. female.   Two days ago patient developed dysuria and urgency, with midline lower abdominal pain but no back or flank pain.  No fevers, chills, and sweats.  Patient's last menstrual period was 10/02/2024 (exact date).   The history is provided by the patient.  Dysuria Pain quality:  Burning Pain severity:  Mild Onset quality:  Sudden Duration:  2 days Timing:  Constant Progression:  Worsening Chronicity:  New Recent urinary tract infections: no   Relieved by:  Nothing Worsened by:  Nothing Ineffective treatments:  None tried Urinary symptoms: frequent urination and hesitancy   Urinary symptoms: no foul-smelling urine, no hematuria and no bladder incontinence   Associated symptoms: abdominal pain   Associated symptoms: no fever, no flank pain, no nausea, no vaginal discharge and no vomiting   Abdominal pain:    Location:  Suprapubic   Severity:  Mild   Onset quality:  Sudden   Duration:  2 days   Timing:  Constant   Progression:  Unchanged   Chronicity:  New   Past Medical History:  Diagnosis Date   Medical history non-contributory     Patient Active Problem List   Diagnosis Date Noted   Normal labor 07/24/2022   Pregnancy 07/24/2022   Normal labor and delivery 04/18/2020    Past Surgical History:  Procedure Laterality Date   NO PAST SURGERIES     WISDOM TOOTH EXTRACTION      OB History     Gravida  2   Para  2   Term  2   Preterm  0   AB  0   Living  2      SAB  0   IAB  0   Ectopic  0   Multiple  0   Live Births  2            Home Medications    Prior to Admission medications  Medication Sig Start Date End Date Taking? Authorizing Provider  cephALEXin  (KEFLEX ) 500 MG capsule Take 1  capsule (500 mg total) by mouth 2 (two) times daily. 10/15/24  Yes Pauline Garnette LABOR, MD  Prenatal Vit-Fe Fumarate-FA (MULTIVITAMIN-PRENATAL) 27-0.8 MG TABS tablet Take 1 tablet by mouth daily at 12 noon.    [provider]    Family History Family History  Problem Relation Age of Onset   Cirrhosis Mother     Social History Social History[1]   Allergies   Patient has no known allergies.   Review of Systems Review of Systems  Constitutional:  Negative for appetite change, chills, diaphoresis, fatigue and fever.  Gastrointestinal:  Positive for abdominal pain. Negative for nausea and vomiting.  Genitourinary:  Positive for dysuria, frequency and urgency. Negative for flank pain, hematuria, pelvic pain and vaginal discharge.  All other systems reviewed and are negative.    Physical Exam Triage Vital Signs ED Triage Vitals  Encounter Vitals Group     BP 10/15/24 1844 113/75     Girls Systolic BP Percentile --      Girls Diastolic BP Percentile --      Boys Systolic BP Percentile --      Boys Diastolic BP Percentile --  Pulse Rate 10/15/24 1844 76     Resp --      Temp --      Temp src --      SpO2 10/15/24 1844 100 %     Weight --      Height --      Head Circumference --      Peak Flow --      Pain Score 10/15/24 1841 0     Pain Loc --      Pain Education --      Exclude from Growth Chart --    No data found.  Updated Vital Signs BP 113/75   Pulse 76   LMP 10/02/2024 (Exact Date)   SpO2 100%   Visual Acuity Right Eye Distance:   Left Eye Distance:   Bilateral Distance:    Right Eye Near:   Left Eye Near:    Bilateral Near:     Physical Exam Nursing notes and Vital Signs reviewed. Appearance:  Patient appears stated age, and in no acute distress.    Eyes:  Pupils are equal, round, and reactive to light and accomodation.  Extraocular movement is intact.  Conjunctivae are not inflamed   Pharynx:  Normal; moist mucous membranes  Neck:   Supple.  No adenopathy Lungs:  Clear to auscultation.  Breath sounds are equal.  Moving air well. Heart:  Regular rate and rhythm without murmurs, rubs, or gallops.  Abdomen:  Nontender without masses or hepatosplenomegaly.  Bowel sounds are present.  No CVA or flank tenderness.  Extremities:  No edema.  Skin:  No rash present.     UC Treatments / Results  Labs (all labs ordered are listed, but only abnormal results are displayed) Labs Reviewed  POCT URINALYSIS DIP (MANUAL ENTRY) - Abnormal; Notable for the following components:      Result Value   Color, UA light yellow (*)    Blood, UA small (*)    Leukocytes, UA Trace (*)    All other components within normal limits  URINE CULTURE    EKG   Radiology No results found.  Procedures Procedures (including critical Lucas time)  Medications Ordered in UC Medications - No data to display  Initial Impression / Assessment and Plan / UC Course  I have reviewed the triage vital signs and the nursing notes.  Pertinent labs & imaging results that were available during my Lucas of the patient were reviewed by me and considered in my medical decision making (see chart for details).     Urine culture pending. Begin Keflex  500mg  BID for 5 days. Followup with Family Doctor if not improved in 5 days. Final Clinical Impressions(s) / UC Diagnoses   Final diagnoses:  Dysuria     Discharge Instructions      Increase fluid intake. May use non-prescription AZO for about two days, if desired, to decrease urinary discomfort.   If symptoms become significantly worse during the night or over the weekend, proceed to the local emergency room.     ED Prescriptions     Medication Sig Dispense Auth. Provider   cephALEXin  (KEFLEX ) 500 MG capsule Take 1 capsule (500 mg total) by mouth 2 (two) times daily. 10 capsule Pauline Garnette LABOR, MD          [1]  Social History Tobacco Use   Smoking status: Never   Smokeless tobacco: Never   Vaping Use   Vaping status: Never Used  Substance Use Topics   Alcohol use:  No   Drug use: No     Pauline Garnette LABOR, MD 10/16/24 1639  "

## 2024-10-15 NOTE — Discharge Instructions (Signed)
 Increase fluid intake. May use non-prescription AZO for about two days, if desired, to decrease urinary discomfort.  If symptoms become significantly worse during the night or over the weekend, proceed to the local emergency room.

## 2024-10-15 NOTE — ED Triage Notes (Signed)
 Pt present with c/o painful urination and urgency x 2 days.  States she has lower abdominal pain and denies back pain.

## 2024-10-17 LAB — URINE CULTURE
Culture: NO GROWTH
Special Requests: NORMAL
